# Patient Record
Sex: Female | Born: 1975 | Race: White | Hispanic: No | Marital: Single | Smoking: Never smoker
Health system: Southern US, Community
[De-identification: ages and names within clinical notes are randomized; demographics above are authoritative.]

## PROBLEM LIST (undated history)

## (undated) DIAGNOSIS — G540 Brachial plexus disorders: Secondary | ICD-10-CM

## (undated) DIAGNOSIS — F419 Anxiety disorder, unspecified: Secondary | ICD-10-CM

## (undated) DIAGNOSIS — J302 Other seasonal allergic rhinitis: Secondary | ICD-10-CM

## (undated) DIAGNOSIS — O9989 Other specified diseases and conditions complicating pregnancy, childbirth and the puerperium: Secondary | ICD-10-CM

## (undated) DIAGNOSIS — IMO0002 Reserved for concepts with insufficient information to code with codable children: Secondary | ICD-10-CM

## (undated) DIAGNOSIS — R87619 Unspecified abnormal cytological findings in specimens from cervix uteri: Secondary | ICD-10-CM

## (undated) DIAGNOSIS — O09529 Supervision of elderly multigravida, unspecified trimester: Secondary | ICD-10-CM

## (undated) DIAGNOSIS — O4440 Low lying placenta NOS or without hemorrhage, unspecified trimester: Secondary | ICD-10-CM

## (undated) HISTORY — DX: Brachial plexus disorders: G54.0

## (undated) HISTORY — PX: WISDOM TOOTH EXTRACTION: SHX21

## (undated) HISTORY — PX: INTRAUTERINE DEVICE (IUD) INSERTION: SHX5877

## (undated) HISTORY — DX: Supervision of elderly multigravida, unspecified trimester: O09.529

## (undated) HISTORY — DX: Low lying placenta nos or without hemorrhage, unspecified trimester: O44.40

## (undated) HISTORY — DX: Unspecified abnormal cytological findings in specimens from cervix uteri: R87.619

## (undated) HISTORY — DX: Reserved for concepts with insufficient information to code with codable children: IMO0002

---
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
## 1993-03-09 HISTORY — PX: KNEE SURGERY: SHX244

## 1998-04-05 ENCOUNTER — Other Ambulatory Visit: Admission: RE | Admit: 1998-04-05 | Discharge: 1998-04-05 | Payer: Self-pay | Admitting: Obstetrics & Gynecology

## 1999-06-25 ENCOUNTER — Other Ambulatory Visit: Admission: RE | Admit: 1999-06-25 | Discharge: 1999-06-25 | Payer: Self-pay | Admitting: Obstetrics & Gynecology

## 2000-09-22 ENCOUNTER — Other Ambulatory Visit: Admission: RE | Admit: 2000-09-22 | Discharge: 2000-09-22 | Payer: Self-pay | Admitting: *Deleted

## 2001-10-20 ENCOUNTER — Other Ambulatory Visit: Admission: RE | Admit: 2001-10-20 | Discharge: 2001-10-20 | Payer: Self-pay | Admitting: Obstetrics & Gynecology

## 2002-10-23 ENCOUNTER — Other Ambulatory Visit: Admission: RE | Admit: 2002-10-23 | Discharge: 2002-10-23 | Payer: Self-pay | Admitting: Obstetrics & Gynecology

## 2004-02-01 ENCOUNTER — Encounter: Admission: RE | Admit: 2004-02-01 | Discharge: 2004-02-01 | Payer: Self-pay | Admitting: Chiropractic Medicine

## 2004-03-20 ENCOUNTER — Encounter: Admission: RE | Admit: 2004-03-20 | Discharge: 2004-03-20 | Payer: Self-pay | Admitting: Orthopaedic Surgery

## 2005-03-09 DIAGNOSIS — O99891 Other specified diseases and conditions complicating pregnancy: Secondary | ICD-10-CM

## 2005-03-09 HISTORY — DX: Other specified diseases and conditions complicating pregnancy: O99.891

## 2005-11-04 ENCOUNTER — Inpatient Hospital Stay (HOSPITAL_COMMUNITY): Admission: AD | Admit: 2005-11-04 | Discharge: 2005-11-08 | Payer: Self-pay | Admitting: Obstetrics & Gynecology

## 2005-11-06 ENCOUNTER — Encounter (INDEPENDENT_AMBULATORY_CARE_PROVIDER_SITE_OTHER): Payer: Self-pay | Admitting: *Deleted

## 2006-10-11 ENCOUNTER — Encounter: Admission: RE | Admit: 2006-10-11 | Discharge: 2006-10-11 | Payer: Self-pay | Admitting: *Deleted

## 2007-11-07 ENCOUNTER — Encounter: Admission: RE | Admit: 2007-11-07 | Discharge: 2007-11-07 | Payer: Self-pay | Admitting: Obstetrics & Gynecology

## 2010-03-09 NOTE — L&D Delivery Note (Signed)
Delivery Note At 10:32 PM a healthy female was delivered via Vaginal, Spontaneous Delivery (Presentation: Left Occiput Anterior).  APGAR: 9, 9; weight .   Placenta status: Intact, Spontaneous.  Cord: 3 vessels with the following complications: None.  Cord pH: not done  Anesthesia: Epidural  Episiotomy: None Lacerations: 1st degree perineal Suture Repair: vicryl rapide Est. Blood Loss (mL):   Mom to postpartum.  Baby to nursery-stable.  Akiba Melfi,MARIE-LYNE 11/20/2010, 12:02 AM

## 2010-03-29 ENCOUNTER — Encounter: Payer: Self-pay | Admitting: Orthopaedic Surgery

## 2010-04-24 LAB — HIV ANTIBODY (ROUTINE TESTING W REFLEX): HIV: NONREACTIVE

## 2010-04-24 LAB — ABO/RH: RH Type: POSITIVE

## 2010-04-24 LAB — ANTIBODY SCREEN: Antibody Screen: NEGATIVE

## 2010-04-24 LAB — RPR: RPR: NONREACTIVE

## 2010-04-24 LAB — HEPATITIS B SURFACE ANTIGEN: Hepatitis B Surface Ag: NEGATIVE

## 2010-04-24 LAB — RUBELLA ANTIBODY, IGM: Rubella: IMMUNE

## 2010-07-25 NOTE — Discharge Summary (Signed)
NAME:  Wendy Lin, PARRILLO NO.:  [id number]   MEDICAL RECORD NO.:  [id number]          PATIENT TYPE:  INP   LOCATION:                                FACILITY:  [facility]   PHYSICIAN:  [place] B. Earlene Plater, M.D.       DATE OF BIRTH:   DATE OF ADMISSION:  11/04/2005  DATE OF DISCHARGE:  11/08/2005                                 DISCHARGE SUMMARY   ADMISSION DIAGNOSES:  1. Term intrauterine pregnancy.  2. Spontaneous labor.  3. Postpartum hemorrhage requiring transfusion.   DISCHARGE DIAGNOSES:  1. Term intrauterine pregnancy.  2. Spontaneous labor.  3. Postpartum hemorrhage requiring transfusion.   HISTORY OF PRESENT ILLNESS:  See the H&P on the chart. Briefly, the patient  presented at 39-6/7 weeks, gravida 1, para 0, spontaneous early labor.   Membranes were ruptured, labor augmented with pitocin and ultimately patient  had a spontaneous vaginal delivery of a viable female, Apgar's were 8 and 9.  Light meconium noted. Nuchal cord x1.  DeLee suction was performed on the  perineum and NICU was present at delivery. The baby did have mild increased  work of breathing and was therefore sent to the NICU for observation.   The patient experienced a moderate postpartum hemorrhage due to uterine  atony. There were no retained products on exam and there was no obvious  laceration on exam. Methergine one dose was necessary as well as uterine  massage and additional pitocin in the IV bag. Estimated blood loss was 700  cc.   Postpartum patient's hemoglobin nadir was 5.9 and she was symptomatic.  Therefore transfused 2 units of packed cells which resolved her symptoms and  her hemoglobin increased to 8.2. She was ultimately discharged home on the  third postpartum day in satisfactory condition.   DISCHARGE INSTRUCTIONS:  Per booklet.   DISCHARGE MEDICATIONS:  1. Ferrous sulfate 325 mg p.o. b.i.d.  2. Motrin 800 mg q.8 hours p.r.n. pain.   FOLLOW UP:  In 2 weeks at the office for  recheck of blood count.   DISCHARGE INSTRUCTIONS:  Discharged in satisfactory condition.      Gerri Spore B. Earlene Plater, M.D.  Electronically Signed     WBD/MEDQ  D:  12/30/2005  T:  12/31/2005  Job:  [id number]

## 2010-07-25 NOTE — H&P (Signed)
NAME:  Wendy Lin, Wendy Lin NO.:  [id number]   MEDICAL RECORD NO.:  [id number]          PATIENT TYPE:  MAT   LOCATION:  [facility]                          FACILITY:  [facility]   PHYSICIAN:  Lenoard Aden, M.D.DATE OF BIRTH:  07-01-1975   DATE OF ADMISSION:  11/04/2005  DATE OF DISCHARGE:                                HISTORY & PHYSICAL   CHIEF COMPLAINT:  Labor   HISTORY OF PRESENT ILLNESS:  She is a 35 year old white female G1, P0, EDD  is November 06, 2005, at 39-6/7 weeks with prodromal labor pattern and  greenish discharge that has been going on since the past Saturday.  Greenish  discharge was evidently evaluated in the office and felt to be normal by  speculum exam and otherwise normal AFI was noted.  The patient then  presented with increased frequency of contractions but no cervical change.  AFI was repeated and found to be between 6 and 7 which was in the lower  limits of normal for 39 weeks.  She was given Stadol with good relief and  now feels uncomfortable with irregular contractions every five to seven  minutes.  She is admitted for augmentation and attempts at delivery.  Risks  and benefits of this plan of management are discussed with the patient and  her husband today.   ALLERGIES:  CODEINE.   MEDICATIONS:  Singulair and prenatal vitamins.   PAST MEDICAL HISTORY:  1. Ascus Pap smear.  2. Wisdom tooth removal.  3. Urinary tract infection.  4. Knee surgery.  5. Gastroenteritis.   SOCIAL HISTORY:  She is a nonsmoker, nondrinker.  She denies domestic or  physical violence.   FAMILY HISTORY:  Mitral valve prolapse, emphysema, migraine headaches,  seizure disorder, depression, obsessive compulsive disorder, heart disease,  myocardial infarction, breast cancer and glaucoma.   PRENATAL COURSE:  Otherwise reportedly and per review of the prenatal record  uncomplicated.   PRIMARY CARE PHYSICIAN:  Genia Del, M.D.   PHYSICAL EXAMINATION:  GENERAL  APPEARANCE:  She is a well-developed, well-  nourished white female in no acute distress.  HEENT:  Normal.  LUNGS:  Clear.  CARDIOVASCULAR:  Regular rhythm.  ABDOMEN:  Soft, gravid and nontender.  Fetal heart rate tracing is reactive.  Contractions are irregular.  There are no decelerations, multiple  accelerations of greater than 15 x 15 beats per minute are noted.  PELVIC:  Cervix is 1-2 cm, vertex, 0 station, posterior, 70% effaced and  membranes are palpably intact.  There is a thickish, greenish mucus that is  noted on the pad and on the perineum which arouses a suspicion of possible  meconium.  EXTREMITIES:  No cords.  NEUROLOGIC:  Nonfocal.   IMPRESSION:  1. A 39-6/7 week intrauterine pregnancy.  2. Prodromal labor pattern.  3. Greenish discharge with low normal AFI, no obvious evidence of      spontaneous rupture of membranes.   PLAN:  Will admit, proceed with placement of an epidural, pitocin  augmentation.  Risks and benefits of this course of management discussed.  Possibility of increased C. section risk due to  augmentation and a prodromal  pattern have been discussed with the patient and her husband today.  They  wish to proceed.  Due to GBS positivity, she will be treated with penicillin  prophylaxis.      Lenoard Aden, M.D.  Electronically Signed     RJT/MEDQ  D:  11/05/2005  T:  11/05/2005  Job:  [id number]

## 2010-10-16 LAB — STREP B DNA PROBE: GBS: NEGATIVE

## 2010-11-14 ENCOUNTER — Telehealth (HOSPITAL_COMMUNITY): Payer: Self-pay | Admitting: *Deleted

## 2010-11-14 ENCOUNTER — Encounter (HOSPITAL_COMMUNITY): Payer: Self-pay | Admitting: *Deleted

## 2010-11-14 NOTE — Telephone Encounter (Signed)
Preadmission screen  

## 2010-11-15 ENCOUNTER — Inpatient Hospital Stay (HOSPITAL_COMMUNITY): Admit: 2010-11-15 | Payer: Self-pay | Admitting: Obstetrics & Gynecology

## 2010-11-17 ENCOUNTER — Telehealth (HOSPITAL_COMMUNITY): Payer: Self-pay | Admitting: *Deleted

## 2010-11-17 ENCOUNTER — Encounter (HOSPITAL_COMMUNITY): Payer: Self-pay | Admitting: *Deleted

## 2010-11-17 NOTE — Telephone Encounter (Signed)
Preadmission screen  

## 2010-11-19 ENCOUNTER — Inpatient Hospital Stay (HOSPITAL_COMMUNITY)
Admission: RE | Admit: 2010-11-19 | Discharge: 2010-11-21 | DRG: 372 | Disposition: A | Discharge status: Home / Self Care | Payer: BC Managed Care – PPO | Source: Ambulatory Visit | Attending: Obstetrics & Gynecology | Admitting: Obstetrics & Gynecology

## 2010-11-19 ENCOUNTER — Encounter (HOSPITAL_COMMUNITY): Payer: Self-pay | Admitting: Anesthesiology

## 2010-11-19 ENCOUNTER — Encounter (HOSPITAL_COMMUNITY): Payer: Self-pay

## 2010-11-19 ENCOUNTER — Inpatient Hospital Stay (HOSPITAL_COMMUNITY): Payer: BC Managed Care – PPO | Admitting: Anesthesiology

## 2010-11-19 DIAGNOSIS — O480 Post-term pregnancy: Principal | ICD-10-CM | POA: Diagnosis present

## 2010-11-19 DIAGNOSIS — O09529 Supervision of elderly multigravida, unspecified trimester: Secondary | ICD-10-CM | POA: Diagnosis present

## 2010-11-19 HISTORY — DX: Other specified diseases and conditions complicating pregnancy, childbirth and the puerperium: O99.89

## 2010-11-19 LAB — CBC
Hemoglobin: 13.3 g/dL (ref 12.0–15.0)
MCV: 90.3 fL (ref 78.0–100.0)
Platelets: 141 10*3/uL — ABNORMAL LOW (ref 150–400)
RBC: 4.35 MIL/uL (ref 3.87–5.11)
WBC: 8 10*3/uL (ref 4.0–10.5)

## 2010-11-19 LAB — RPR: RPR Ser Ql: NONREACTIVE

## 2010-11-19 MED ORDER — LACTATED RINGERS IV SOLN: 500.0000 mL | Freq: Once | INTRAVENOUS | Status: DC

## 2010-11-19 MED ORDER — LIDOCAINE HCL (PF) 1 % IJ SOLN
30.0000 mL | INTRAMUSCULAR | Status: DC | PRN
  Filled 2010-11-19: qty 30

## 2010-11-19 MED ORDER — FENTANYL 2.5 MCG/ML BUPIVACAINE 1/10 % EPIDURAL INFUSION (WH - ANES): 14.0000 mL/h | INTRAMUSCULAR | Status: DC

## 2010-11-19 MED ORDER — OXYTOCIN BOLUS FROM INFUSION
500.0000 mL | Freq: Once | INTRAVENOUS | Status: DC
  Filled 2010-11-19: qty 500
  Filled 2010-11-19: qty 1000

## 2010-11-19 MED ORDER — LIDOCAINE HCL 1.5 % IJ SOLN
INTRAMUSCULAR | Status: DC | PRN
  Administered 2010-11-19 (×2): 4 mL via EPIDURAL

## 2010-11-19 MED ORDER — PHENYLEPHRINE 40 MCG/ML (10ML) SYRINGE FOR IV PUSH (FOR BLOOD PRESSURE SUPPORT): 80.0000 ug | PREFILLED_SYRINGE | INTRAVENOUS | Status: DC | PRN

## 2010-11-19 MED ORDER — FENTANYL 2.5 MCG/ML BUPIVACAINE 1/10 % EPIDURAL INFUSION (WH - ANES)
14.0000 mL/h | INTRAMUSCULAR | Status: DC
  Filled 2010-11-19: qty 60

## 2010-11-19 MED ORDER — LACTATED RINGERS IV SOLN
INTRAVENOUS | Status: DC
  Administered 2010-11-19: 21:00:00 via INTRAVENOUS
  Administered 2010-11-19: 800 mL via INTRAVENOUS
  Administered 2010-11-19: 125 mL/h via INTRAVENOUS

## 2010-11-19 MED ORDER — DIPHENHYDRAMINE HCL 50 MG/ML IJ SOLN: 12.5000 mg | INTRAMUSCULAR | Status: DC | PRN

## 2010-11-19 MED ORDER — PHENYLEPHRINE 40 MCG/ML (10ML) SYRINGE FOR IV PUSH (FOR BLOOD PRESSURE SUPPORT)
80.0000 ug | PREFILLED_SYRINGE | INTRAVENOUS | Status: DC | PRN
  Filled 2010-11-19: qty 5

## 2010-11-19 MED ORDER — NALBUPHINE SYRINGE 5 MG/0.5 ML
10.0000 mg | INJECTION | Freq: Four times a day (QID) | INTRAMUSCULAR | Status: DC | PRN
  Filled 2010-11-19: qty 1

## 2010-11-19 MED ORDER — OXYCODONE-ACETAMINOPHEN 5-325 MG PO TABS
2.0000 | ORAL_TABLET | ORAL | Status: DC | PRN
  Filled 2010-11-19: qty 2
  Filled 2010-11-19: qty 1

## 2010-11-19 MED ORDER — FENTANYL 2.5 MCG/ML BUPIVACAINE 1/10 % EPIDURAL INFUSION (WH - ANES)
INTRAMUSCULAR | Status: DC | PRN
  Administered 2010-11-19: 15 mL/h via EPIDURAL

## 2010-11-19 MED ORDER — PHENYLEPHRINE 40 MCG/ML (10ML) SYRINGE FOR IV PUSH (FOR BLOOD PRESSURE SUPPORT)
80.0000 ug | PREFILLED_SYRINGE | INTRAVENOUS | Status: DC | PRN
  Filled 2010-11-19 (×2): qty 5

## 2010-11-19 MED ORDER — ACETAMINOPHEN 325 MG PO TABS: 650.0000 mg | ORAL_TABLET | ORAL | Status: DC | PRN

## 2010-11-19 MED ORDER — NALBUPHINE SYRINGE 5 MG/0.5 ML
5.0000 mg | INJECTION | INTRAMUSCULAR | Status: DC | PRN
  Filled 2010-11-19: qty 0.5

## 2010-11-19 MED ORDER — EPHEDRINE 5 MG/ML INJ: 10.0000 mg | INTRAVENOUS | Status: DC | PRN

## 2010-11-19 MED ORDER — IBUPROFEN 600 MG PO TABS
600.0000 mg | ORAL_TABLET | Freq: Four times a day (QID) | ORAL | Status: DC | PRN
  Administered 2010-11-20: 600 mg via ORAL
  Filled 2010-11-19 (×4): qty 1

## 2010-11-19 MED ORDER — METHYLERGONOVINE MALEATE 0.2 MG/ML IJ SOLN
INTRAMUSCULAR | Status: AC
  Administered 2010-11-19: 0.2 mg
  Filled 2010-11-19: qty 1

## 2010-11-19 MED ORDER — TERBUTALINE SULFATE 1 MG/ML IJ SOLN: 0.2500 mg | Freq: Once | INTRAMUSCULAR | Status: AC | PRN

## 2010-11-19 MED ORDER — CITRIC ACID-SODIUM CITRATE 334-500 MG/5ML PO SOLN: 30.0000 mL | ORAL | Status: DC | PRN

## 2010-11-19 MED ORDER — OXYTOCIN 20 UNITS IN LACTATED RINGERS INFUSION - SIMPLE
2.0000 m[IU]/min | INTRAVENOUS | Status: DC
  Administered 2010-11-19: 6 m[IU]/min via INTRAVENOUS
  Administered 2010-11-19: 3 m[IU]/min via INTRAVENOUS
  Administered 2010-11-19: 2 m[IU]/min via INTRAVENOUS
  Filled 2010-11-19: qty 1000

## 2010-11-19 MED ORDER — EPHEDRINE 5 MG/ML INJ
10.0000 mg | INTRAVENOUS | Status: DC | PRN
  Filled 2010-11-19 (×2): qty 4

## 2010-11-19 MED ORDER — EPHEDRINE 5 MG/ML INJ
10.0000 mg | INTRAVENOUS | Status: DC | PRN
  Filled 2010-11-19: qty 4

## 2010-11-19 MED ORDER — LACTATED RINGERS IV SOLN: 500.0000 mL | INTRAVENOUS | Status: DC | PRN

## 2010-11-19 MED ORDER — FLEET ENEMA 7-19 GM/118ML RE ENEM: 1.0000 | ENEMA | RECTAL | Status: DC | PRN

## 2010-11-19 MED ORDER — OXYTOCIN 20 UNITS IN LACTATED RINGERS INFUSION - SIMPLE
125.0000 mL/h | Freq: Once | INTRAVENOUS | Status: AC
  Administered 2010-11-19: 900 mL/h via INTRAVENOUS

## 2010-11-19 MED ORDER — OXYTOCIN 10 UNIT/ML IJ SOLN
INTRAMUSCULAR | Status: AC
  Administered 2010-11-19: 20 [IU] via INTRAVENOUS
  Filled 2010-11-19: qty 2

## 2010-11-19 MED ORDER — ONDANSETRON HCL 4 MG/2ML IJ SOLN: 4.0000 mg | Freq: Four times a day (QID) | INTRAMUSCULAR | Status: DC | PRN

## 2010-11-19 NOTE — Progress Notes (Signed)
Dr. Seymour Bars at bedside.  Strip reviewed.  Pt prepped for delivery.

## 2010-11-19 NOTE — Anesthesia Preprocedure Evaluation (Signed)
Anesthesia Evaluation  Name, MR# and DOB Patient awake  General Assessment Comment  Reviewed: Allergy & Precautions, H&P , Patient's Chart, lab work & pertinent test results  Airway Mallampati: III TM Distance: >3 FB Neck ROM: full    Dental No notable dental hx. (+) Teeth Intact   Pulmonary  clear to auscultation  pulmonary exam normalPulmonary Exam Normal breath sounds clear to auscultation none    Cardiovascular regular Normal    Neuro/Psych Negative Neurological ROS  Negative Psych ROS  GI/Hepatic/Renal negative GI ROS  negative Liver ROS  negative Renal ROS        Endo/Other  Negative Endocrine ROS (+)      Abdominal   Musculoskeletal   Hematology negative hematology ROS (+)   Peds  Reproductive/Obstetrics (+) Pregnancy    Anesthesia Other Findings             Anesthesia Physical Anesthesia Plan  ASA: II  Anesthesia Plan: Epidural   Post-op Pain Management:    Induction:   Airway Management Planned:   Additional Equipment:   Intra-op Plan:   Post-operative Plan:   Informed Consent: I have reviewed the patients History and Physical, chart, labs and discussed the procedure including the risks, benefits and alternatives for the proposed anesthesia with the patient or authorized representative who has indicated his/her understanding and acceptance.     Plan Discussed with: Anesthesiologist  Anesthesia Plan Comments:         Anesthesia Quick Evaluation  

## 2010-11-19 NOTE — Progress Notes (Addendum)
  S: Doing well, pain controlled with  Epidural. O: BP 130/107  Pulse 63  Temp(Src) 98.9 F (37.2 C) (Oral)  Resp 18  Ht 5\' 6"  (1.676 m)  Wt 75.751 kg (167 lb)  BMI 26.95 kg/m2  LMP 02/07/2010   FHT:  FHR: 120 bpm, variability: moderate,  accelerations:  Present,  decelerations:  Absent UC:   regular, every 2-3 minutes SVE:   Dilation: 4 Effacement (%): 90 Station: -1 Exam by:: Colon Flattery, CNM IUPC placed,   A / P: Induction of labor due to postdates,  progressing well on pitocin Exaggerated sims, reposition frequently Titrate pitocin to MVU 200 Fetal Wellbeing:  Category I Pain Control:  Epidural  Anticipated MOD:  NSVD Update to Dr. Seymour Bars, MD to follow.  Wendy Lin 11/19/2010, 8:50 PM

## 2010-11-19 NOTE — H&P (Signed)
Wendy Lin is a 35 y.o. female presenting for induction of labor post-dates Care at [facility] since first trim with Dr. Seymour Bars as primary.  Pitocin induction started this AM, AROM since 1320. Starting to get uncomfortable with contractions and desires epidural.  . Maternal Medical History:  Reason for admission: Reason for Admission:   nauseaIOL for post dates  Fetal activity: Perceived fetal activity is normal.    Prenatal complications: Hx PP hemorrhage with first birth after long difficult labor.  Low lying placenta resolved at 35 weeks.   Prenatal Complications - Diabetes: none.    OB History    Grav Para Term Preterm Abortions TAB SAB Ect Mult Living   2 1 1       1      Past Medical History  Diagnosis Date  . Low-lying placenta   . AMA (advanced maternal age) multigravida 35+   . Abnormal Pap smear     ASCUS, low grade squamous intraepithelial lesion  . Blood transfusion complicating pregnancy 2007    s/p uterine atony   Past Surgical History  Procedure Date  . Knee surgery 1995  . Mouth surgery 1994   Family History: family history includes Birth defects in her maternal aunt; COPD in her maternal grandfather; Cancer in her mother and paternal grandfather; Depression in her sister; Heart disease in her maternal grandmother, maternal uncle, and mother; Mental illness in her sister; Migraines in her sister; Mitral valve prolapse in her maternal uncle, mother, and sister; Seizures in her mother; and Vision loss in her father and maternal grandfather. Social History:  reports that she has never smoked. She does not have any smokeless tobacco history on file. She reports that she does not drink alcohol or use illicit drugs.  Review of Systems  Eyes: Negative for blurred vision.  Gastrointestinal: Negative for nausea and vomiting.  Neurological: Negative for headaches.    Dilation: 2 Effacement (%): 50 Station: -2 Exam by:: Montez Morita, RNC Blood pressure 117/85,  pulse 54, temperature 98.9 F (37.2 C), temperature source Oral, resp. rate 18, height 5\' 6"  (1.676 m), weight 75.751 kg (167 lb), last menstrual period 02/07/2010. Maternal Exam:  Uterine Assessment: Contraction strength is moderate.  Contraction frequency is regular.  Q 2-3 min on Pitocin  Abdomen: Fundal height is S=D.   Estimated fetal weight is 7.5.   Fetal presentation: vertex  Introitus: Normal vulva. Normal vagina.  Pelvis: adequate for delivery.   Cervix: Cervix evaluated by sterile speculum exam.     Fetal Exam Fetal Monitor Review: Mode: ultrasound.   Baseline rate: 130.  Variability: moderate (6-25 bpm).   Pattern: accelerations present and no decelerations.    Fetal State Assessment: Category I - tracings are normal.     Physical Exam  Constitutional: She is oriented to person, place, and time. She appears well-developed and well-nourished. No distress.  Cardiovascular: Normal rate and regular rhythm.   Respiratory: Effort normal and breath sounds normal.  GI: Soft. There is no tenderness.       gravid  Genitourinary: Vagina normal and uterus normal.  Musculoskeletal: She exhibits no edema.  Neurological: She is alert and oriented to person, place, and time.    Prenatal labs: ABO, Rh: O/Positive/-- (02/16 0000) Antibody: Negative (02/16 0000) Rubella:  immune RPR: NON REACTIVE (09/12 0815)  HBsAg: Negative (02/16 0000)  HIV: Non-reactive (02/16 0000)  GBS: Negative (08/09 0000)  1GTT: wnl Genetic screens WNL [place]: nl anatomy, posterior placenta Sono at 36 wks:AGA 69%, nl AFI  Assessment/Plan: 1. IUP at term 2. IOL post dates 3. Hx PP hemorrhage, plan active management of 3rd stage. 4. Latent labor with reassuring fetal status  5. Epidural PRN 6. IUPC  PAUL,DANIELA 11/19/2010, 7:43 PM

## 2010-11-19 NOTE — Progress Notes (Signed)
Clement Sayres, RN at bedside with pt for epidural placement.

## 2010-11-19 NOTE — Progress Notes (Signed)
Foley cath placed at this time.  Pt tolerated well.

## 2010-11-19 NOTE — Anesthesia Procedure Notes (Signed)
Epidural Patient location during procedure: OB Start time: 11/19/2010 8:06 PM  Staffing Anesthesiologist: Landra Howze A. Performed by: anesthesiologist   Preanesthetic Checklist Completed: patient identified, site marked, surgical consent, pre-op evaluation, timeout performed, IV checked, risks and benefits discussed and monitors and equipment checked  Epidural Patient position: sitting Prep: site prepped and draped and DuraPrep Patient monitoring: continuous pulse ox and blood pressure Approach: midline Injection technique: LOR air  Needle:  Needle type: Tuohy  Needle gauge: 17 G Needle length: 9 cm Needle insertion depth: 5 cm cm Catheter type: closed end flexible Catheter size: 19 Gauge Catheter at skin depth: 10 cm Test dose: negative and 1.5% lidocaine  Assessment Events: blood not aspirated, injection not painful, no injection resistance, negative IV test and no paresthesia  Additional Notes Patient is more comfortable after epidural dosed. Please see RN's note for documentation of vital signs and FHR which are stable.

## 2010-11-19 NOTE — Progress Notes (Signed)
Colon Flattery, CNM at bedside. Discussing epidural with pt.  Pt wishes to proceed with epidural.

## 2010-11-19 NOTE — Progress Notes (Signed)
Extern monitors placed for IUPC placement.

## 2010-11-20 ENCOUNTER — Encounter (HOSPITAL_COMMUNITY): Payer: Self-pay

## 2010-11-20 LAB — CBC
MCV: 91.4 fL (ref 78.0–100.0)
Platelets: 122 10*3/uL — ABNORMAL LOW (ref 150–400)
RBC: 3.49 MIL/uL — ABNORMAL LOW (ref 3.87–5.11)
RDW: 12.8 % (ref 11.5–15.5)

## 2010-11-20 MED ORDER — LANOLIN HYDROUS EX OINT: TOPICAL_OINTMENT | CUTANEOUS | Status: DC | PRN

## 2010-11-20 MED ORDER — BENZOCAINE-MENTHOL 20-0.5 % EX AERO
INHALATION_SPRAY | CUTANEOUS | Status: AC
  Administered 2010-11-20: 1 via TOPICAL
  Filled 2010-11-20: qty 56

## 2010-11-20 MED ORDER — IBUPROFEN 600 MG PO TABS
600.0000 mg | ORAL_TABLET | Freq: Four times a day (QID) | ORAL | Status: DC
  Administered 2010-11-20 – 2010-11-21 (×3): 600 mg via ORAL
  Filled 2010-11-20: qty 1

## 2010-11-20 MED ORDER — ONDANSETRON HCL 4 MG PO TABS
4.0000 mg | ORAL_TABLET | ORAL | Status: DC | PRN
Start: 2010-11-20 — End: 2010-11-21

## 2010-11-20 MED ORDER — TETANUS-DIPHTH-ACELL PERTUSSIS 5-2.5-18.5 LF-MCG/0.5 IM SUSP
0.5000 mL | Freq: Once | INTRAMUSCULAR | Status: AC
  Administered 2010-11-21: 0.5 mL via INTRAMUSCULAR
  Filled 2010-11-20 (×2): qty 0.5

## 2010-11-20 MED ORDER — BENZOCAINE-MENTHOL 20-0.5 % EX AERO
1.0000 "application " | INHALATION_SPRAY | CUTANEOUS | Status: DC | PRN
  Administered 2010-11-20: 1 via TOPICAL

## 2010-11-20 MED ORDER — MISOPROSTOL 200 MCG PO TABS
ORAL_TABLET | ORAL | Status: AC
  Administered 2010-11-20: 1000 ug via RECTAL
  Filled 2010-11-20: qty 5

## 2010-11-20 MED ORDER — OXYCODONE-ACETAMINOPHEN 5-325 MG PO TABS
1.0000 | ORAL_TABLET | ORAL | Status: DC | PRN
  Administered 2010-11-20 (×2): 1 via ORAL
  Filled 2010-11-20: qty 1

## 2010-11-20 MED ORDER — ZOLPIDEM TARTRATE 5 MG PO TABS
5.0000 mg | ORAL_TABLET | Freq: Every evening | ORAL | Status: DC | PRN
Start: 2010-11-20 — End: 2010-11-21

## 2010-11-20 MED ORDER — METHYLERGONOVINE MALEATE 0.2 MG/ML IJ SOLN: 0.2000 mg | Freq: Four times a day (QID) | INTRAMUSCULAR | Status: AC

## 2010-11-20 MED ORDER — OXYTOCIN 20 UNITS IN LACTATED RINGERS INFUSION - SIMPLE
125.0000 mL/h | INTRAVENOUS | Status: DC
  Filled 2010-11-20: qty 1000

## 2010-11-20 MED ORDER — SIMETHICONE 80 MG PO CHEW: 80.0000 mg | CHEWABLE_TABLET | ORAL | Status: DC | PRN

## 2010-11-20 MED ORDER — MISOPROSTOL 200 MCG PO TABS
1000.0000 ug | ORAL_TABLET | Freq: Once | ORAL | Status: AC
  Administered 2010-11-20: 1000 ug via RECTAL

## 2010-11-20 MED ORDER — DIPHENHYDRAMINE HCL 25 MG PO CAPS: 25.0000 mg | ORAL_CAPSULE | Freq: Four times a day (QID) | ORAL | Status: DC | PRN

## 2010-11-20 MED ORDER — METHYLERGONOVINE MALEATE 0.2 MG PO TABS
0.2000 mg | ORAL_TABLET | Freq: Four times a day (QID) | ORAL | Status: AC
  Administered 2010-11-20 – 2010-11-21 (×4): 0.2 mg via ORAL
  Filled 2010-11-20 (×4): qty 1

## 2010-11-20 MED ORDER — OXYTOCIN 20 UNITS IN LACTATED RINGERS INFUSION - SIMPLE
125.0000 mL/h | INTRAVENOUS | Status: AC
  Filled 2010-11-20: qty 1000

## 2010-11-20 MED ORDER — WITCH HAZEL-GLYCERIN EX PADS: 1.0000 "application " | MEDICATED_PAD | CUTANEOUS | Status: DC | PRN

## 2010-11-20 MED ORDER — DIBUCAINE 1 % RE OINT: 1.0000 "application " | TOPICAL_OINTMENT | RECTAL | Status: DC | PRN

## 2010-11-20 MED ORDER — METHYLERGONOVINE MALEATE 0.2 MG/ML IJ SOLN
INTRAMUSCULAR | Status: AC
  Administered 2010-11-20: 0.2 mg via INTRAMUSCULAR
  Filled 2010-11-20: qty 1

## 2010-11-20 MED ORDER — PRENATAL PLUS 27-1 MG PO TABS
1.0000 | ORAL_TABLET | Freq: Every day | ORAL | Status: DC
  Administered 2010-11-20 – 2010-11-21 (×2): 1 via ORAL
  Filled 2010-11-20 (×2): qty 1

## 2010-11-20 MED ORDER — SENNOSIDES-DOCUSATE SODIUM 8.6-50 MG PO TABS
2.0000 | ORAL_TABLET | Freq: Every day | ORAL | Status: DC
  Administered 2010-11-20: 2 via ORAL

## 2010-11-20 MED ORDER — ONDANSETRON HCL 4 MG/2ML IJ SOLN: 4.0000 mg | INTRAMUSCULAR | Status: DC | PRN

## 2010-11-20 NOTE — Anesthesia Postprocedure Evaluation (Signed)
  Anesthesia Post-op Note  Patient: Wendy Lin  Procedure(s) Performed: * Lumbar Epidural for L & D *  Patient Location: Rm. 317  Anesthesia Type: Epidural  Level of Consciousness: awake, alert  and oriented  Airway and Oxygen Therapy: Patient Spontanous Breathing  Post-op Pain: none  Post-op Assessment: Post-op Vital signs reviewed, Patient's Cardiovascular Status Stable, Respiratory Function Stable, Patent Airway, No signs of Nausea or vomiting, Pain level controlled, No headache, No backache, No residual numbness and No residual motor weakness  Post-op Vital Signs: Reviewed and stable  Complications: No apparent anesthesia complications

## 2010-11-20 NOTE — Progress Notes (Signed)
UR Chart review completed.  

## 2010-11-20 NOTE — Progress Notes (Signed)
Post Partum Day #1            Subjective:   Pt reports feeling well/ Tolerating po/ Voiding without problems/ No n/v/ up ad lib Bleeding is decreasing now, was heavy during the night, received Methergine IM and PO, and Cytotec PR/  Pain controlled withprescription NSAID's including ibuprofen (Motrin) and narcotic analgesics including Percocet  Newborn info:  Information for the patient's newborn:  Joyceline, Maiorino [[id number]]  female  / circumcision done / feeding breast          Objective:     VS: Blood pressure 110/64, pulse 72, temperature 98.6 F (37 C), temperature source Oral, resp. rate 18, height 5\' 6"  (1.676 m), weight 75.751 kg (167 lb), last menstrual period 02/07/2010, SpO2 98.00%,  currently breastfeeding.   Intake/Output Summary (Last 24 hours) at 11/20/10 1058 Last data filed at 11/20/10 0640  Gross per 24 hour  Intake    240 ml  Output   1200 ml  Net   -960 ml        Basename 11/20/10 0528 11/19/10 0815  WBC 18.5* 8.0  HGB 10.8* 13.3  HCT 31.9* 39.3  PLT 122* 141*     Blood type: O/Positive/-- (02/16 0000)  Rubella: Immune (02/16 0000)     Physical Exam:   A & O x 3 NAD   Lungs: CTAB  Heart: RR  Abdomen: soft, non-tender, FF @ U  Perineum: repair intact, edema minimal  Lochia: small  Extremities: neg Homans', edema none    Assessment/Plan:  PPD # 1 / 35 y.o., [id number] S/P:induced vaginal Principal Problem:  *Postpartum care following vaginal delivery (9/12) Hx of PP hemorrhage with first child Uterine atony now resolved after Methergine and Cytotec, will continue to monitor closely. Patient showed how to massage own uterus Hgb stable   normal postpartum exam  Doing well  Continue routine post partum orders  Anticipate D/C in AM         LOS: 1 day   Jaritza Duignan, CNM, MSN 11/20/2010, 10:58 AM

## 2010-11-20 NOTE — Progress Notes (Signed)
Patient transferred to room 317 for continued post partum care.

## 2010-11-21 MED ORDER — PRENATAL PLUS 27-1 MG PO TABS: 1.0000 | ORAL_TABLET | Freq: Every day | ORAL | Status: DC

## 2010-11-21 MED ORDER — MENTHOL 3 MG MT LOZG: 1.0000 | LOZENGE | OROMUCOSAL | Status: DC | PRN

## 2010-11-21 NOTE — Progress Notes (Signed)
Encounter addended by: Madison Hickman on: 11/21/2010 10:26 AM<BR>     Documentation filed: Notes Section, Charges VN

## 2010-11-21 NOTE — Anesthesia Postprocedure Evaluation (Signed)
  Anesthesia Post-op Note  Patient: Wendy Lin  Procedure(s) Performed: * No procedures listed *  Patient Location: [facility] Unit  Anesthesia Type: Epidural  Level of Consciousness: awake, alert  and oriented  Airway and Oxygen Therapy: Patient Spontanous Breathing  Post-op Pain: none  Post-op Assessment: Post-op Vital signs reviewed, Patient's Cardiovascular Status Stable, No headache, No backache, No residual numbness and No residual motor weakness  Post-op Vital Signs: Reviewed and stable  Complications: No apparent anesthesia complications

## 2010-11-21 NOTE — Discharge Summary (Signed)
Obstetric Discharge Summary Reason for Admission: IOL, post dates Prenatal Procedures: ultrasound Intrapartum Procedures: spontaneous vaginal delivery; hx pp hemorrhage with previous delivery Postpartum Procedures: none Complications-Operative and Postpartum: none Hemoglobin  Date Value Range Status  11/20/2010 10.8* 12.0-15.0 (g/dL) Final     REPEATED TO VERIFY     DELTA CHECK NOTED     HCT  Date Value Range Status  11/20/2010 31.9* 36.0-46.0 (%) Final    Discharge Diagnoses: Term Pregnancy-delivered  Discharge Information: Date: 11/21/2010 Activity: pelvic rest Diet: routine Medications: PNV Condition: stable Instructions: refer to practice specific booklet Discharge to: home Follow-up Information    Follow up with LAVOIE,MARIE-LYNE.   Contact information:   [address] [place] [address] [phone]          Newborn Data: Live born female on 11/19/2010 Birth Weight: 7 lb 15.3 oz (3609 g) APGAR: 9, 9  Home with mother.  Khai Arrona K 11/21/2010, 10:31 AM

## 2010-11-21 NOTE — Progress Notes (Signed)
  PPD # 2  Subjective: Pt reports feeling well and eager for d/c home/ Pain controlled with prescription NSAID's including ibuprofen Tolerating po/ Voiding without problems/ No n/v Bleeding is moderate/ Newborn info:  Information for the patient's newborn:  Deshara, Rossi [[id number]]  female  / circ complete/ Feeding: breast    Objective:  VS: Blood pressure 125/73, pulse 62, temperature 98.6 F (37 C), temperature source Oral, resp. rate 18, height 5\' 6"  (1.676 m), weight 75.751 kg (167 lb), last menstrual period 02/07/2010, SpO2 98.00%, unknown if currently breastfeeding.    Basename 11/20/10 0528 11/19/10 0815  WBC 18.5* 8.0  HGB 10.8* 13.3  HCT 31.9* 39.3  PLT 122* 141*    Blood type: O/Positive/-- (02/16 0000) Rubella: Immune (02/16 0000)    Physical Exam:  General: alert, cooperative and no distress Abdomen: soft, nontender, normal bowel sounds Uterine Fundus: firm, below umbilicus, nontender Perineum: not inspected Lochia: moderate Ext: Homans sign is negative, no sign of DVT and no edema, redness or tenderness in the calves or thighs   A/P: PPD # 2/ [id number] Doing well and stable for discharge home RX: Ibuprofen 600mg  po Q 6 hrs prn pain #30 Refill x 1 WOB/GYN booklet given Routine pp visit in 6wks

## 2013-01-11 ENCOUNTER — Other Ambulatory Visit: Payer: Self-pay

## 2013-01-11 DIAGNOSIS — Z1231 Encounter for screening mammogram for malignant neoplasm of breast: Secondary | ICD-10-CM

## 2013-02-16 ENCOUNTER — Ambulatory Visit
Admission: RE | Admit: 2013-02-16 | Discharge: 2013-02-16 | Disposition: A | Discharge status: Home / Self Care | Payer: BC Managed Care – PPO | Source: Ambulatory Visit

## 2013-02-16 DIAGNOSIS — Z1231 Encounter for screening mammogram for malignant neoplasm of breast: Secondary | ICD-10-CM

## 2014-01-08 ENCOUNTER — Encounter (HOSPITAL_COMMUNITY): Payer: Self-pay

## 2015-03-10 HISTORY — PX: COLPOSCOPY: SHX161

## 2016-08-06 ENCOUNTER — Telehealth: Payer: Self-pay | Admitting: *Deleted

## 2016-08-06 NOTE — Telephone Encounter (Signed)
Patient had C&B done at [facility] on 03/18/16 was told to come back in 6 months are we scheduling repeat pap smear? (paper chart placed on your desk) Please advise

## 2016-08-06 NOTE — Telephone Encounter (Signed)
Chart reviewed.  Yes, please schedule Repeat Pap 10/2016.

## 2016-08-07 ENCOUNTER — Telehealth: Payer: Self-pay | Admitting: *Deleted

## 2016-08-07 NOTE — Telephone Encounter (Signed)
Sent claudia message to schedule.

## 2016-08-07 NOTE — Telephone Encounter (Signed)
Pt called c/o night sweats, felt lightheaded a couple of time, asked what this could be has mirena IUD. I explained to pt could be related to several different issues, I advised pt lab could be ran, for CBC, TSH, etc. But would need visit to do this. Pt verbalized she understood, will monitor and if continues will schedule OV.

## 2016-10-14 ENCOUNTER — Ambulatory Visit: Payer: Self-pay | Admitting: Obstetrics & Gynecology

## 2016-10-14 ENCOUNTER — Encounter: Payer: Self-pay | Admitting: Obstetrics & Gynecology

## 2016-10-14 ENCOUNTER — Ambulatory Visit (INDEPENDENT_AMBULATORY_CARE_PROVIDER_SITE_OTHER): Payer: BLUE CROSS/BLUE SHIELD | Admitting: Obstetrics & Gynecology

## 2016-10-14 VITALS — BP 114/70 | Ht 66.0 in | Wt 133.0 lb

## 2016-10-14 DIAGNOSIS — B977 Papillomavirus as the cause of diseases classified elsewhere: Secondary | ICD-10-CM

## 2016-10-14 DIAGNOSIS — Z1151 Encounter for screening for human papillomavirus (HPV): Secondary | ICD-10-CM

## 2016-10-14 DIAGNOSIS — N870 Mild cervical dysplasia: Secondary | ICD-10-CM

## 2016-10-14 NOTE — Addendum Note (Signed)
Addended by: Thurnell Garbe A on: 10/14/2016 12:34 PM   Modules accepted: Orders

## 2016-10-14 NOTE — Progress Notes (Signed)
    Wendy Lin [birth year] [id number]        41 y.o.  G2P2002 Divorced.  Stable boyfriend x 1 year.  Children 5th grade and Kindergarden.  RP:  H/O Abnormal Pap/HPV HR pos  HPI:  Had Colpo 6 months ago.  Will obtain Medical records.  Per patient HPV 16-18-45 neg.  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    Past medical history,surgical history, problem list, medications, allergies, family history and social history were all reviewed and documented in the EPIC chart.  Directed ROS with pertinent positives and negatives documented in the history of present illness/assessment and plan.  Exam:  Vitals:   10/14/16 1155  BP: 114/70  Weight: 133 lb (60.3 kg)  Height: 5' 6"  (1.676 m)   General appearance:  Normal  Gyn exam:  Vulva normal                     Speculum:  Cervix/Vagina normal.  Pap/HPV HR done.  Assessment/Plan:  41 y.o. G2P2002   1. 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  F/U Annual/Gyn exam in 6 months.  2. 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  Princess Bruins MD, 12:01 PM 10/14/2016

## 2016-10-14 NOTE — Patient Instructions (Signed)
1. 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  F/U Annual/Gyn exam in 6 months.  2. 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  Wendy Lin, it was a pleasure to see you today!  I will inform you of your results as soon as available.

## 2016-10-19 LAB — PAP, TP IMAGING W/ HPV RNA, RFLX HPV TYPE 16,18/45: HPV mRNA, High Risk: NOT DETECTED

## 2017-12-10 ENCOUNTER — Encounter: Payer: BLUE CROSS/BLUE SHIELD | Admitting: Obstetrics & Gynecology

## 2017-12-14 ENCOUNTER — Ambulatory Visit: Payer: BLUE CROSS/BLUE SHIELD | Admitting: Obstetrics & Gynecology

## 2017-12-14 ENCOUNTER — Encounter: Payer: Self-pay | Admitting: Obstetrics & Gynecology

## 2017-12-14 VITALS — BP 110/70 | Ht 66.0 in | Wt 134.0 lb

## 2017-12-14 DIAGNOSIS — Z01419 Encounter for gynecological examination (general) (routine) without abnormal findings: Secondary | ICD-10-CM

## 2017-12-14 DIAGNOSIS — Z1151 Encounter for screening for human papillomavirus (HPV): Secondary | ICD-10-CM | POA: Diagnosis not present

## 2017-12-14 DIAGNOSIS — Z23 Encounter for immunization: Secondary | ICD-10-CM

## 2017-12-14 DIAGNOSIS — Z30431 Encounter for routine checking of intrauterine contraceptive device: Secondary | ICD-10-CM

## 2017-12-14 DIAGNOSIS — N870 Mild cervical dysplasia: Secondary | ICD-10-CM

## 2017-12-14 NOTE — Progress Notes (Signed)
Wendy Lin 1976-01-22 [id number]   History:    42 y.o. G2P2L2 Boyfriend x 2 yrs.  Children:  Son 6th grade at [employer].  Daughter 1st grade.  RP:  Established patient presenting for annual gyn exam   HPI: Well on Mirena IUD x 02/2016.  No BTB.  No pelvic pain.  Normal vaginal secretions.  No pain with IC.  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  HPV 16-18-45 neg.  Pap neg/HPV HR neg 10/2016. Urine/BMs wnl. Breasts wnl.  BMI 21.63.  Fit and healthy nutrition.  Health labs with Fam MD.  Past medical history,surgical history, family history and social history were all reviewed and documented in the EPIC chart.  Gynecologic History No LMP recorded. (Menstrual status: IUD). Contraception: Mirena IUD x 02/2016 Last Pap: 10/2016. Results were: Negative/HPV HR neg.   Last mammogram: 2019. Results were: normal Bone Density: Never Colonoscopy: Never  Obstetric History OB History  Gravida Para Term Preterm AB Living  2 2 2     2   SAB TAB Ectopic Multiple Live Births          2    # Outcome Date GA Lbr Len/2nd Weight Sex Delivery Anes PTL Lv  2 Term 11/19/10 52w5d04:10 / 00:22 7 lb 15.3 oz (3.609 kg) M Vag-Spont EPI  LIV     Birth Comments: None  1 Term 2007 464w0d7 lb 8 oz (3.402 kg) M Vag-Spont EPI  LIV     ROS: A ROS was performed and pertinent positives and negatives are included in the history.  GENERAL: No fevers or chills. HEENT: No change in vision, no earache, sore throat or sinus congestion. NECK: No pain or stiffness. CARDIOVASCULAR: No chest pain or pressure. No palpitations. PULMONARY: No shortness of breath, cough or wheeze. GASTROINTESTINAL: No abdominal pain, nausea, vomiting or diarrhea, melena or bright red blood per rectum. GENITOURINARY: No urinary frequency, urgency, hesitancy or dysuria. MUSCULOSKELETAL: No joint or muscle pain, no back pain, no recent trauma. DERMATOLOGIC: No rash, no itching, no lesions. ENDOCRINE: No polyuria, polydipsia, no heat or cold intolerance. No recent change in  weight. HEMATOLOGICAL: No anemia or easy bruising or bleeding. NEUROLOGIC: No headache, seizures, numbness, tingling or weakness. PSYCHIATRIC: No depression, no loss of interest in normal activity or change in sleep pattern.     Exam:   BP 110/70   Ht 5' 6"  (1.676 m)   Wt 134 lb (60.8 kg)   BMI 21.63 kg/m   Body mass index is 21.63 kg/m.  General appearance : Well developed well nourished female. No acute distress HEENT: Eyes: no retinal hemorrhage or exudates,  Neck supple, trachea midline, no carotid bruits, no thyroidmegaly Lungs: Clear to auscultation, no rhonchi or wheezes, or rib retractions  Heart: Regular rate and rhythm, no murmurs or gallops Breast:Examined in sitting and supine position were symmetrical in appearance, no palpable masses or tenderness,  no skin retraction, no nipple inversion, no nipple discharge, no skin discoloration, no axillary or supraclavicular lymphadenopathy Abdomen: no palpable masses or tenderness, no rebound or guarding Extremities: no edema or skin discoloration or tenderness  Pelvic: Vulva: Normal             Vagina: No gross lesions or discharge  Cervix: No gross lesions or discharge.  Strings visible.  Pap /HPV HR done  Uterus  AV, normal size, shape and consistency, non-tender and mobile  Adnexa  Without masses or tenderness  Anus: Normal   Assessment/Plan:  [age].o. female for annual exam  1. Encounter for routine gynecological examination with Papanicolaou smear of cervix Normal gynecologic exam.  Pap with high-risk HPV done today.  Breast exam normal.  Screening mammogram was negative in 2019.  Health labs with family physician.  2. Encounter for routine checking of intrauterine contraceptive device (IUD) Mirena IUD since 2017 well-tolerated and in good position.  3. Dysplasia of cervix, low grade (CIN 1) History of mild cervical dysplasia on colposcopy in January 2018.  Last Pap test was negative in August 2018.  Repeat Pap test  with high risk HPV today.  Princess Bruins MD, 11:22 AM 12/14/2017

## 2017-12-16 LAB — PAP, TP IMAGING W/ HPV RNA, RFLX HPV TYPE 16,18/45: HPV DNA High Risk: NOT DETECTED

## 2017-12-19 ENCOUNTER — Encounter: Payer: Self-pay | Admitting: Obstetrics & Gynecology

## 2017-12-19 NOTE — Patient Instructions (Signed)
1. Encounter for routine gynecological examination with Papanicolaou smear of cervix Normal gynecologic exam.  Pap with high-risk HPV done today.  Breast exam normal.  Screening mammogram was negative in 2019.  Health labs with family physician.  2. Encounter for routine checking of intrauterine contraceptive device (IUD) Mirena IUD since 2017 well-tolerated and in good position.  3. Dysplasia of cervix, low grade (CIN 1) History of mild cervical dysplasia on colposcopy in January 2018.  Last Pap test was negative in August 2018.  Repeat Pap test with high risk HPV today.  Wendy Lin, a pleasure seeing you today!  I will inform you of your results as soon as they are available.

## 2018-10-06 ENCOUNTER — Other Ambulatory Visit: Payer: Self-pay

## 2018-10-06 ENCOUNTER — Encounter: Payer: Self-pay | Admitting: Allergy

## 2018-10-06 ENCOUNTER — Ambulatory Visit (INDEPENDENT_AMBULATORY_CARE_PROVIDER_SITE_OTHER): Payer: BC Managed Care – PPO | Admitting: Allergy

## 2018-10-06 VITALS — BP 110/82 | HR 70 | Temp 97.9°F | Resp 16 | Ht 66.0 in | Wt 134.0 lb

## 2018-10-06 DIAGNOSIS — L2089 Other atopic dermatitis: Secondary | ICD-10-CM

## 2018-10-06 DIAGNOSIS — J3089 Other allergic rhinitis: Secondary | ICD-10-CM | POA: Diagnosis not present

## 2018-10-06 MED ORDER — TRIAMCINOLONE ACETONIDE 0.1 % EX OINT: 1.0000 "application " | TOPICAL_OINTMENT | Freq: Two times a day (BID) | CUTANEOUS | 5 refills | Status: DC

## 2018-10-06 MED ORDER — PIMECROLIMUS 1 % EX CREA: TOPICAL_CREAM | Freq: Two times a day (BID) | CUTANEOUS | 5 refills | Status: DC

## 2018-10-06 NOTE — Progress Notes (Signed)
New Patient Note  RE: Wendy Lin MRN: [id number] DOB: August 30, 1975 Date of Office Visit: 10/06/2018  Referring provider: Helane Rima, MD Primary care provider: Helane Rima, MD  Chief Complaint: rash    History of present illness: Wendy Lin is a 43 y.o. female presenting today for consultation for rash.   She states she has been having allergy symptoms since last Oct 2019.  She states the symptoms are itchy rash.   She has seen her PCP and dermatologist for the rash.  She states she will get the rash in armpits, eyelid, behind ears, palms.  The rash is red and splotchy and itchy.  The rash comes and goes.  Every morning she wakes up and her palms are very itchy.   The rash of eyelids or armpits or behind the ears she states may be present every other day. She used to eat oatmeal a lot and thought this was a trigger to the rash thus she took oatmeal out of diet however the rash continued.  She also took gluten  products out of the diet as she does feel it makes her itchier.  She does feel like a food is a trigger of her rash.  She moisturizes with lubriderm, oil of olay or emollients without fragrance and dyes.  She has been using triamcinolone but does not feel that it is very effective.    She denies any swelling, fevers, GI, respiratory or CV related symptoms with the rash.    She does report spring allergy symptoms with nasal congestion and will use nasacort.   No history eczema, asthma or food allergy.    Review of systems: Review of Systems  Constitutional: Negative for chills, fever and malaise/fatigue.  HENT: Negative for congestion, ear discharge, nosebleeds and sore throat.   Eyes: Negative for pain, discharge and redness.  Respiratory: Negative for cough, shortness of breath and wheezing.   Cardiovascular: Negative for chest pain.  Gastrointestinal: Negative for abdominal pain, constipation, diarrhea, heartburn, nausea and vomiting.  Musculoskeletal:  Negative for joint pain.  Skin: Positive for itching and rash.  Neurological: Negative for headaches.    All other systems negative unless noted above in HPI  Past medical history: Past Medical History:  Diagnosis Date  . Abnormal Pap smear    ASCUS, low grade squamous intraepithelial lesion  . AMA (advanced maternal age) multigravida 35+   . Blood transfusion complicating pregnancy 2094   s/p uterine atony  . Low-lying placenta   . Postpartum care following vaginal delivery (9/12) 11/20/2010    Past surgical history: Past Surgical History:  Procedure Laterality Date  . COLPOSCOPY  2017  . KNEE SURGERY  1995  . MOUTH SURGERY  1994  . WISDOM TOOTH EXTRACTION      Family history:  Family History  Problem Relation Age of Onset  . Mitral valve prolapse Mother   . Seizures Mother        in pregnancy  . Heart disease Mother   . Cancer Mother        breast  . Vision loss Father        glacoma  . Mitral valve prolapse Sister   . Depression Sister   . Migraines Sister   . Mental illness Sister   . Mitral valve prolapse Maternal Uncle   . Heart disease Maternal Uncle   . Heart disease Maternal Grandmother   . COPD Maternal Grandfather   . Vision loss Maternal Grandfather  glacoma  . Birth defects Maternal Aunt        Breast  . Cancer Paternal Grandfather        prostate ca    Social history: She lives in a home with carpeting in the bedroom with gas heating and central cooling.  There are 2 cats and 1 turtle in the home.  There is no concern for water damage, mildew or roaches in the home.  She is a [employer].  She denies a smoking history.  Medication List: Allergies as of 10/06/2018      Reactions   Codeine Nausea And Vomiting      Medication List       Accurate as of October 06, 2018  5:12 PM. If you have any questions, ask your nurse or doctor.        ARTIFICIAL TEARS PF OP Apply to eye.   levonorgestrel 20 MCG/24HR IUD Commonly  known as: MIRENA by Intrauterine route.   neomycin-polymyxin b-dexamethasone 3.5-10000-0.1 Oint Commonly known as: MAXITROL   pimecrolimus 1 % cream Commonly known as: Elidel Apply topically 2 (two) times daily. Started by: Eshika Reckart Charmian Muff, MD   triamcinolone 55 MCG/ACT Aero nasal inhaler Commonly known as: NASACORT Place 2 sprays into the nose daily.   triamcinolone cream 0.1 % Commonly known as: KENALOG APPLY 1 APPLICATION ON THE SKIN TWICE A DAY AS NEEDED FOR FLARES NOT TO BE APPLIED TO FACE What changed: Another medication with the same name was added. Make sure you understand how and when to take each. Changed by: Rehana Uncapher Charmian Muff, MD   triamcinolone ointment 0.1 % Commonly known as: KENALOG Apply 1 application topically 2 (two) times daily. What changed: You were already taking a medication with the same name, and this prescription was added. Make sure you understand how and when to take each. Changed by: Stormie Ventola Charmian Muff, MD       Known medication allergies: Allergies  Allergen Reactions  . Codeine Nausea And Vomiting     Physical examination: Blood pressure 110/82, pulse 70, temperature 97.9 F (36.6 C), temperature source Temporal, resp. rate 16, height 5' 6"  (1.676 m), weight 134 lb (60.8 kg), SpO2 99 %.  General: Alert, interactive, in no acute distress. HEENT: PERRLA, TMs pearly gray, turbinates non-edematous without discharge, post-pharynx non erythematous. Neck: Supple without lymphadenopathy. Lungs: Clear to auscultation without wheezing, rhonchi or rales. {no increased work of breathing. CV: Normal S1, S2 without murmurs. Abdomen: Nondistended, nontender. Skin: Warm and dry, without lesions or rashes.  Rashes not present today at time of visit. Extremities:  No clubbing, cyanosis or edema. Neuro:   Grossly intact.  Diagnositics/Labs:  Allergy testing: Environmental allergy skin prick testing is positive to molds, dust mites  and cat. Intradermal testing is negative. Select food allergy skin prick testing is positive to wheat, pork, [place], cabbage, watermelon. Allergy testing results were read and interpreted by provider, documented by clinical staff.   Assessment and plan: Rash  - description of the rash is likely eczema variant or seborrheic dermatitis   - Bathe and soak for 10 minutes in warm water once a day. Pat dry.  Immediately apply the below ointment/cream prescribed to red/scaly/itchy/patchy areas only. Wait 5-10 minutes and then apply emollients like Lubriderm, CeraVe, Cetaphil, Aquaphor, Vaseline twice a day all over.   To red/scaly/itchy/patchy areas on the face and neck, apply: . Elidel 1% ointment twice a day as needed. . Be careful to avoid the eye itself. To red/scaly/itchy/patchy areas on  the body (below the face and neck), apply: . Triamcinolone 0.1 % ointment twice a day as needed (do not use in armpits or groin area) . May also use Elidel on the body as well which can be used in the armpits or groin area  - environmental allergy testing is positive to cat, molds, dust mites - food allergy testing is positive wheat, pork, [place], cabbage, watermelon.   We discussed being sensitized to foods vs being allergic today.  If you have noted worsening symptoms after eating wheat, pork, [place], cabbage or watermelon then you are allergic and should remove from the diet.  If you do not note any symptoms after eating these foods then you are sensitized and can keep in the diet.  At this time do not feel you warrant need of an epinephrine device.  If you develop symptoms of severe allergic reaction let us know.       Allergic rhinitis  - environmental allergy testing as above  - continue Nasacort 2 sprays each nostril daily as needed for nasal congestion.  Use for 1-2 weeks at a time before stopping for maximum benefit.    - if needed can take long-acting antihistamine like Zyrtec, Allegra or Xyzal   Follow-up 4 months or sooner if needed  I appreciate the opportunity to take part in Katrese's care. Please do not hesitate to contact me with questions.  Sincerely,   Prudy Feeler, MD Allergy/Immunology [facility]

## 2018-10-06 NOTE — Patient Instructions (Addendum)
Rash  - description of the rash is likely eczema variant or seborrheic dermatitis   - Bathe and soak for 10 minutes in warm water once a day. Pat dry.  Immediately apply the below ointment/cream prescribed to red/scaly/itchy/patchy areas only. Wait 5-10 minutes and then apply emollients like Lubriderm, CeraVe, Cetaphil, Aquaphor, Vaseline twice a day all over.   To red/scaly/itchy/patchy areas on the face and neck, apply: . Elidel 1% ointment twice a day as needed. . Be careful to avoid the eye itself. To red/scaly/itchy/patchy areas on the body (below the face and neck), apply: . Triamcinolone 0.1 % ointment twice a day as needed (do not use in armpits or groin area) . May also use Elidel on the body as well which can be used in the armpits or groin area  - environmental allergy testing is positive to cat, molds, dust mites - food allergy testing is positive wheat, pork, [place], cabbage, watermelon.   We discussed being sensitized to foods vs being allergic today.  If you have noted worsening symptoms after eating wheat, pork, [place], cabbage or watermelon then you are allergic and should remove from the diet.  If you do not note any symptoms after eating these foods then you are sensitized and can keep in the diet.  At this time do not feel you warrant need of an epinephrine device.  If you develop symptoms of severe allergic reaction let us know.       Allergies  - environmental allergy testing as above  - continue Nasacort 2 sprays each nostril daily as needed for nasal congestion.  Use for 1-2 weeks at a time before stopping for maximum benefit.    - if needed can take long-acting antihistamine like Zyrtec, Allegra or Xyzal  Follow-up 4 months or sooner if needed

## 2018-11-12 ENCOUNTER — Emergency Department (HOSPITAL_BASED_OUTPATIENT_CLINIC_OR_DEPARTMENT_OTHER)
Admission: EM | Admit: 2018-11-12 | Discharge: 2018-11-12 | Disposition: A | Discharge status: Home / Self Care | Payer: BC Managed Care – PPO | Attending: Emergency Medicine | Admitting: Emergency Medicine

## 2018-11-12 ENCOUNTER — Emergency Department (HOSPITAL_BASED_OUTPATIENT_CLINIC_OR_DEPARTMENT_OTHER): Payer: BC Managed Care – PPO

## 2018-11-12 ENCOUNTER — Encounter (HOSPITAL_BASED_OUTPATIENT_CLINIC_OR_DEPARTMENT_OTHER): Payer: Self-pay | Admitting: Emergency Medicine

## 2018-11-12 ENCOUNTER — Other Ambulatory Visit: Payer: Self-pay

## 2018-11-12 DIAGNOSIS — S060X0A Concussion without loss of consciousness, initial encounter: Secondary | ICD-10-CM

## 2018-11-12 DIAGNOSIS — W2209XA Striking against other stationary object, initial encounter: Secondary | ICD-10-CM | POA: Insufficient documentation

## 2018-11-12 DIAGNOSIS — Z79899 Other long term (current) drug therapy: Secondary | ICD-10-CM | POA: Insufficient documentation

## 2018-11-12 DIAGNOSIS — Y999 Unspecified external cause status: Secondary | ICD-10-CM | POA: Insufficient documentation

## 2018-11-12 DIAGNOSIS — R51 Headache: Secondary | ICD-10-CM | POA: Diagnosis present

## 2018-11-12 DIAGNOSIS — Y929 Unspecified place or not applicable: Secondary | ICD-10-CM | POA: Diagnosis not present

## 2018-11-12 DIAGNOSIS — Y9344 Activity, trampolining: Secondary | ICD-10-CM | POA: Insufficient documentation

## 2018-11-12 LAB — HCG, SERUM, QUALITATIVE: Preg, Serum: NEGATIVE

## 2018-11-12 LAB — BASIC METABOLIC PANEL
Anion gap: 10 (ref 5–15)
BUN: 12 mg/dL (ref 6–20)
CO2: 23 mmol/L (ref 22–32)
Calcium: 9.3 mg/dL (ref 8.9–10.3)
Chloride: 106 mmol/L (ref 98–111)
Creatinine, Ser: 0.77 mg/dL (ref 0.44–1.00)
GFR calc Af Amer: >60 mL/min (ref >60)
GFR calc non Af Amer: >60 mL/min (ref >60)
Glucose, Bld: 95 mg/dL (ref 70–99)
Potassium: 3.4 mmol/L — ABNORMAL LOW (ref 3.5–5.1)
Sodium: 139 mmol/L (ref 135–145)

## 2018-11-12 LAB — CBC WITH DIFFERENTIAL/PLATELET
Abs Immature Granulocytes: 0.02 10*3/uL (ref 0.00–0.07)
Basophils Absolute: 0 10*3/uL (ref 0.0–0.1)
Basophils Relative: 0 %
Eosinophils Absolute: 0.1 10*3/uL (ref 0.0–0.5)
Eosinophils Relative: 1 %
HCT: 43.3 % (ref 36.0–46.0)
Hemoglobin: 13.8 g/dL (ref 12.0–15.0)
Immature Granulocytes: 0 %
Lymphocytes Relative: 22 %
Lymphs Abs: 1.4 10*3/uL (ref 0.7–4.0)
MCH: 30.5 pg (ref 26.0–34.0)
MCHC: 31.9 g/dL (ref 30.0–36.0)
MCV: 95.6 fL (ref 80.0–100.0)
Monocytes Absolute: 0.3 10*3/uL (ref 0.1–1.0)
Monocytes Relative: 5 %
Neutro Abs: 4.6 10*3/uL (ref 1.7–7.7)
Neutrophils Relative %: 72 %
Platelets: 218 10*3/uL (ref 150–400)
RBC: 4.53 MIL/uL (ref 3.87–5.11)
RDW: 11.9 % (ref 11.5–15.5)
WBC: 6.4 10*3/uL (ref 4.0–10.5)
nRBC: 0 % (ref 0.0–0.2)

## 2018-11-12 NOTE — Discharge Instructions (Addendum)
1.  Follow-up with your neurologist for persistent postconcussive symptoms. 2.  Try to rest and decrease any screen time on your computer or phone. 3.  Return to the emergency department if your symptoms are worsening or progressing.

## 2018-11-12 NOTE — ED Provider Notes (Signed)
[facility] EMERGENCY DEPARTMENT Provider Note   CSN: [id number] Arrival date & time: 11/12/18  1243     History   Chief Complaint Chief Complaint  Patient presents with  . Head Injury    HPI Wendy Lin is a 43 y.o. female.     HPI Patient was struck in the back of the head on 8/25 while jumping on the trampoline.  No loss of consciousness though she did feel lightheaded.  States she has had persistent left posterior headache and over the last few days has developed nausea and dizziness.  She describes the dizziness as feeling like she is going to pass out.  She denies any visual changes.  No focal weakness or numbness.  She does have some ringing in her ears left greater than right.  No other recent head injury.  Think she may have sustained a concussion when she was a child. Past Medical History:  Diagnosis Date  . Abnormal Pap smear    ASCUS, low grade squamous intraepithelial lesion  . AMA (advanced maternal age) multigravida 34+   . Blood transfusion complicating pregnancy 4098   s/p uterine atony  . Low-lying placenta   . Postpartum care following vaginal delivery (9/12) 11/20/2010    Patient Active Problem List   Diagnosis Date Noted  . Postpartum care following vaginal delivery (9/12) 11/20/2010    Past Surgical History:  Procedure Laterality Date  . COLPOSCOPY  2017  . KNEE SURGERY  1995  . MOUTH SURGERY  1994  . WISDOM TOOTH EXTRACTION       OB History    Gravida  2   Para  2   Term  2   Preterm      AB      Living  2     SAB      TAB      Ectopic      Multiple      Live Births  2            Home Medications    Prior to Admission medications   Medication Sig Start Date End Date Taking? Authorizing Provider  Dextran 70-Hypromellose (ARTIFICIAL TEARS PF OP) Apply to eye.    [provider]  levonorgestrel (MIRENA) 20 MCG/24HR IUD by Intrauterine route.    [provider]  neomycin-polymyxin  b-dexamethasone (MAXITROL) 3.5-10000-0.1 OINT  02/24/18   [provider]  pimecrolimus (ELIDEL) 1 % cream Apply topically 2 (two) times daily. 10/06/18   Kennith Gain, MD  triamcinolone (NASACORT) 55 MCG/ACT AERO nasal inhaler Place 2 sprays into the nose daily.    [provider]  triamcinolone cream (KENALOG) 0.1 % APPLY 1 APPLICATION ON THE SKIN TWICE A DAY AS NEEDED FOR FLARES NOT TO BE APPLIED TO FACE 05/07/18   [provider]  triamcinolone ointment (KENALOG) 0.1 % Apply 1 application topically 2 (two) times daily. 10/06/18   Kennith Gain, MD    Family History Family History  Problem Relation Age of Onset  . Mitral valve prolapse Mother   . Seizures Mother        in pregnancy  . Heart disease Mother   . Cancer Mother        breast  . Vision loss Father        glacoma  . Mitral valve prolapse Sister   . Depression Sister   . Migraines Sister   . Mental illness Sister   . Mitral valve prolapse Maternal Uncle   .  Heart disease Maternal Uncle   . Heart disease Maternal Grandmother   . COPD Maternal Grandfather   . Vision loss Maternal Grandfather        glacoma  . Birth defects Maternal Aunt        Breast  . Cancer Paternal Grandfather        prostate ca    Social History Social History   Tobacco Use  . Smoking status: Never Smoker  . Smokeless tobacco: Never Used  Substance Use Topics  . Alcohol use: Yes    Comment: SOCIAL  . Drug use: No     Allergies   Codeine   Review of Systems Review of Systems  Constitutional: Negative for chills and fever.  HENT: Negative for sinus pressure and trouble swallowing.   Eyes: Negative for visual disturbance.  Respiratory: Negative for cough and shortness of breath.   Cardiovascular: Negative for chest pain.  Gastrointestinal: Positive for nausea. Negative for abdominal pain, constipation, diarrhea and vomiting.  Musculoskeletal: Negative for back pain, myalgias and  neck pain.  Skin: Negative for rash and wound.  Neurological: Positive for dizziness, light-headedness and headaches. Negative for seizures, syncope, weakness and numbness.  All other systems reviewed and are negative.    Physical Exam Updated Vital Signs BP 124/89 (BP Location: Left Arm)   Pulse 79   Temp 98.2 F (36.8 C) (Oral)   Ht 5' 6"  (1.676 m)   Wt 59 kg   SpO2 100%   BMI 20.98 kg/m   Physical Exam Vitals signs and nursing note reviewed.  Constitutional:      Appearance: Normal appearance. She is well-developed.  HENT:     Head: Normocephalic and atraumatic.     Comments: Mild tenderness to palpation in the left occipital region.  No obvious injury.  Bilateral TMs are normal.  No postauricular or periorbital ecchymosis.    Nose: Nose normal.     Mouth/Throat:     Mouth: Mucous membranes are moist.  Eyes:     Extraocular Movements: Extraocular movements intact.     Pupils: Pupils are equal, round, and reactive to light.     Comments: No nystagmus present  Neck:     Musculoskeletal: Normal range of motion and neck supple. No neck rigidity or muscular tenderness.     Comments: No posterior midline cervical tenderness to palpation. Cardiovascular:     Rate and Rhythm: Normal rate and regular rhythm.  Pulmonary:     Effort: Pulmonary effort is normal.     Breath sounds: Normal breath sounds.  Abdominal:     General: Bowel sounds are normal.     Palpations: Abdomen is soft.     Tenderness: There is no abdominal tenderness. There is no guarding or rebound.  Musculoskeletal: Normal range of motion.        General: No swelling, tenderness, deformity or signs of injury.     Right lower leg: No edema.     Left lower leg: No edema.  Lymphadenopathy:     Cervical: No cervical adenopathy.  Skin:    General: Skin is warm and dry.     Findings: No erythema or rash.  Neurological:     General: No focal deficit present.     Mental Status: She is alert and oriented to  person, place, and time.     Comments: Patient is alert and oriented x3 with clear, goal oriented speech. Patient has 5/5 motor in all extremities. Sensation is intact to light touch. Patient  has a normal gait and walks without assistance.  Psychiatric:        Behavior: Behavior normal.      ED Treatments / Results  Labs (all labs ordered are listed, but only abnormal results are displayed) Labs Reviewed  CBC WITH DIFFERENTIAL/PLATELET  BASIC METABOLIC PANEL  HCG, SERUM, QUALITATIVE    EKG None  Radiology No results found.  Procedures Procedures (including critical care time)  Medications Ordered in ED Medications - No data to display   Initial Impression / Assessment and Plan / ED Course  I have reviewed the triage vital signs and the nursing notes.  Pertinent labs & imaging results that were available during my care of the patient were reviewed by me and considered in my medical decision making (see chart for details).        Suspect possible postconcussion syndrome.  Will screen with labs.  Shared decision making with patient regarding obtaining CT of her head.  Decided she would like to get the scan.  Will sign out to oncoming emergency physician.  Final Clinical Impressions(s) / ED Diagnoses   Final diagnoses:  Concussion without loss of consciousness, initial encounter    ED Discharge Orders    None       Julianne Rice, MD 11/12/18 1451

## 2018-11-12 NOTE — ED Triage Notes (Signed)
Pt c/o headache since 8/25 after being kneed in head on trampoline. Denies loc, c/o nausea, and dizziness.

## 2018-12-16 ENCOUNTER — Encounter: Payer: BLUE CROSS/BLUE SHIELD | Admitting: Obstetrics & Gynecology

## 2019-01-04 ENCOUNTER — Other Ambulatory Visit: Payer: Self-pay

## 2019-01-05 ENCOUNTER — Encounter: Payer: Self-pay | Admitting: Obstetrics & Gynecology

## 2019-01-05 ENCOUNTER — Ambulatory Visit (INDEPENDENT_AMBULATORY_CARE_PROVIDER_SITE_OTHER): Payer: BC Managed Care – PPO | Admitting: Obstetrics & Gynecology

## 2019-01-05 VITALS — BP 112/76 | Ht 66.0 in | Wt 134.0 lb

## 2019-01-05 DIAGNOSIS — R300 Dysuria: Secondary | ICD-10-CM

## 2019-01-05 DIAGNOSIS — Z01419 Encounter for gynecological examination (general) (routine) without abnormal findings: Secondary | ICD-10-CM

## 2019-01-05 DIAGNOSIS — Z30431 Encounter for routine checking of intrauterine contraceptive device: Secondary | ICD-10-CM | POA: Diagnosis not present

## 2019-01-05 LAB — URINALYSIS, COMPLETE W/RFL CULTURE
Bacteria, UA: NONE SEEN /HPF
Bilirubin Urine: NEGATIVE
Glucose, UA: NEGATIVE
Hgb urine dipstick: NEGATIVE
Hyaline Cast: NONE SEEN /LPF
Ketones, ur: NEGATIVE
Leukocyte Esterase: NEGATIVE
Nitrites, Initial: NEGATIVE
Protein, ur: NEGATIVE
RBC / HPF: NONE SEEN /HPF (ref 0–2)
Specific Gravity, Urine: 1.015 (ref 1.001–1.03)
WBC, UA: NONE SEEN /HPF (ref 0–5)
pH: 5.5 (ref 5.0–8.0)

## 2019-01-05 LAB — NO CULTURE INDICATED

## 2019-01-05 NOTE — Progress Notes (Signed)
Wendy Lin January 18, 1976 [id number]   History:    43 y.o. G2P2L2 Boyfriend x 3 yrs.  Children:  Son 7th grade at [employer].  Daughter 2nd grade.  RP:  Established patient presenting for annual gyn exam   HPI: Well on Mirena IUD x 02/2016.  No BTB.  No pelvic pain.  Normal vaginal secretions.  No pain with IC.  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  HPV 16-18-45 neg.  Pap neg/HPV HR neg 10/2016. Urine currently normal.  Had pain with urination 2 weeks ago, which resolved with increased hydration.  BMs wnl. Breasts wnl.  BMI 21.63.  Fit and healthy nutrition.  Health labs with Fam MD.  Past medical history,surgical history, family history and social history were all reviewed and documented in the EPIC chart.  Gynecologic History No LMP recorded. (Menstrual status: IUD). Contraception: Mirena IUD x 2017 Last Pap: 12/2017. Results were: Negative/HPV HR neg.  CIN 1 in 03/2016. Last mammogram: 04/2018. Results were: Benign Bone Density: Never Colonoscopy: Never  Obstetric History OB History  Gravida Para Term Preterm AB Living  2 2 2     2   SAB TAB Ectopic Multiple Live Births          2    # Outcome Date GA Lbr Len/2nd Weight Sex Delivery Anes PTL Lv  2 Term 11/19/10 51w5d04:10 / 00:22 7 lb 15.3 oz (3.609 kg) M Vag-Spont EPI  LIV     Birth Comments: None  1 Term 2007 482w0d7 lb 8 oz (3.402 kg) M Vag-Spont EPI  LIV     ROS: A ROS was performed and pertinent positives and negatives are included in the history.  GENERAL: No fevers or chills. HEENT: No change in vision, no earache, sore throat or sinus congestion. NECK: No pain or stiffness. CARDIOVASCULAR: No chest pain or pressure. No palpitations. PULMONARY: No shortness of breath, cough or wheeze. GASTROINTESTINAL: No abdominal pain, nausea, vomiting or diarrhea, melena or bright red blood per rectum. GENITOURINARY: No urinary frequency, urgency, hesitancy or dysuria. MUSCULOSKELETAL: No joint or muscle pain, no back pain, no recent trauma. DERMATOLOGIC: No  rash, no itching, no lesions. ENDOCRINE: No polyuria, polydipsia, no heat or cold intolerance. No recent change in weight. HEMATOLOGICAL: No anemia or easy bruising or bleeding. NEUROLOGIC: No headache, seizures, numbness, tingling or weakness. PSYCHIATRIC: No depression, no loss of interest in normal activity or change in sleep pattern.     Exam:   BP 112/76   Ht 5' 6"  (1.676 m)   Wt 134 lb (60.8 kg)   BMI 21.63 kg/m   Body mass index is 21.63 kg/m.  General appearance : Well developed well nourished female. No acute distress HEENT: Eyes: no retinal hemorrhage or exudates,  Neck supple, trachea midline, no carotid bruits, no thyroidmegaly Lungs: Clear to auscultation, no rhonchi or wheezes, or rib retractions  Heart: Regular rate and rhythm, no murmurs or gallops Breast:Examined in sitting and supine position were symmetrical in appearance, no palpable masses or tenderness,  no skin retraction, no nipple inversion, no nipple discharge, no skin discoloration, no axillary or supraclavicular lymphadenopathy Abdomen: no palpable masses or tenderness, no rebound or guarding Extremities: no edema or skin discoloration or tenderness  Pelvic: Vulva: Normal             Vagina: No gross lesions or discharge.  [facility] Cervix: No gross lesions or discharge. Pap reflex done.  Uterus  AV, normal size, shape and consistency, non-tender and  mobile  Adnexa  Without masses or tenderness  Anus: Normal  U/A: Negative   Assessment/Plan:  43 y.o. female for annual exam   1. Encounter for routine gynecological examination with Papanicolaou smear of cervix Normal gynecologic exam.  Pap reflex done.  Breast exam normal.  Screening mammogram in February 2020 was benign.  Health labs with family physician.  2. Encounter for routine checking of intrauterine contraceptive device (IUD) Mirena IUD in good position and well-tolerated.  Insertion of the IUD was in 2017.  3. Dysuria U/A  negative. - Urinalysis,Complete w/RFL Culture  Princess Bruins MD, 12:10 PM 01/05/2019

## 2019-01-06 LAB — PAP IG W/ RFLX HPV ASCU

## 2019-01-09 ENCOUNTER — Encounter: Payer: Self-pay | Admitting: Obstetrics & Gynecology

## 2019-01-09 NOTE — Patient Instructions (Signed)
1. Encounter for routine gynecological examination with Papanicolaou smear of cervix Normal gynecologic exam.  Pap reflex done.  Breast exam normal.  Screening mammogram in February 2020 was benign.  Health labs with family physician.  2. Encounter for routine checking of intrauterine contraceptive device (IUD) Mirena IUD in good position and well-tolerated.  Insertion of the IUD was in 2017.  3. Dysuria U/A negative. - Urinalysis,Complete w/RFL Culture  Jacy, it was a pleasure seeing you today!  I will inform you of your results as soon as they are available.

## 2019-11-02 IMAGING — CT CT HEAD W/O CM
3 series · 16 of 47 positions shown, 19 images · non-contrast
Comparison: None.

CLINICAL DATA: 11/01/18 had injury on trampoline- knee to posterior
aspect of head,having headache and dizziness at times

EXAM:
CT HEAD WITHOUT CONTRAST
TECHNIQUE: Contiguous axial images were obtained from the base of the skull
through the vertex without intravenous contrast.

[Series 2: head wo · axial · 0.46mm/px · z∈[+870,+1006]mm · 10 of 33 slices shown, 13 images]
[im 3/33  brain]
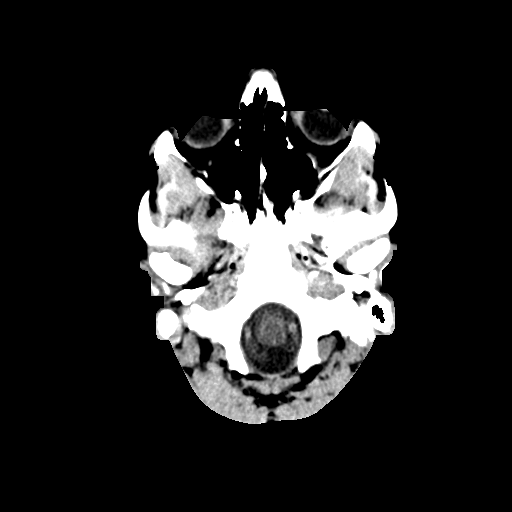
[im 3/33  bone]
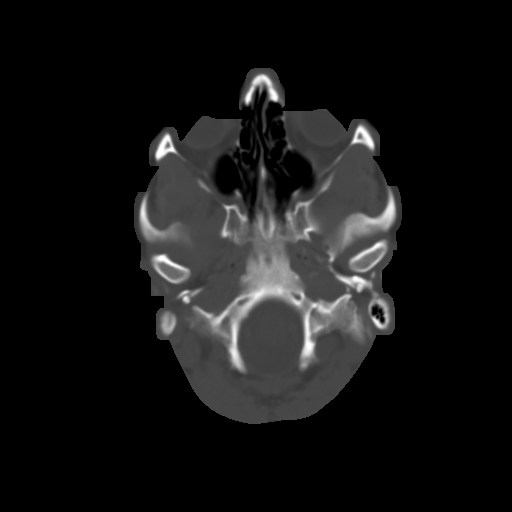
[im 6/33  brain]
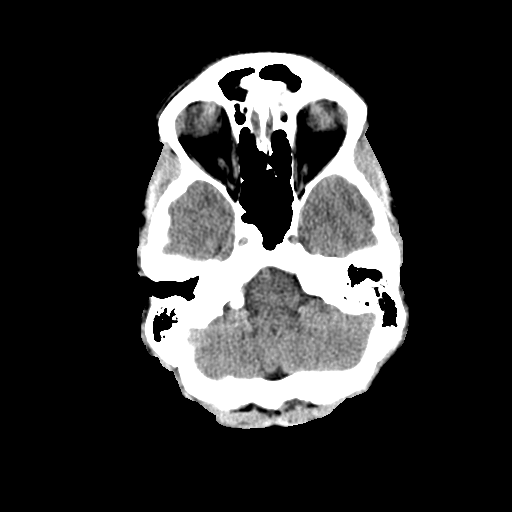
[im 9/33  brain]
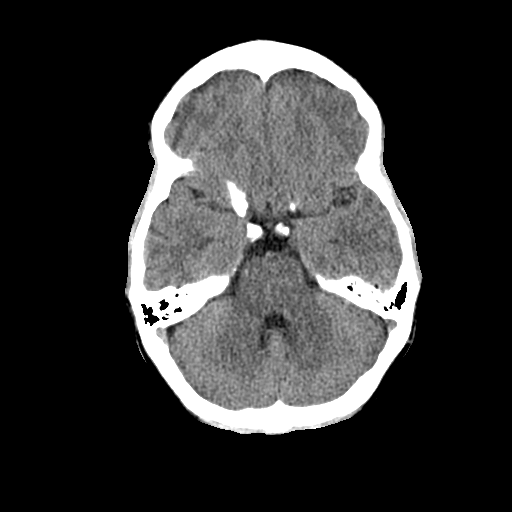
[im 12/33  brain]
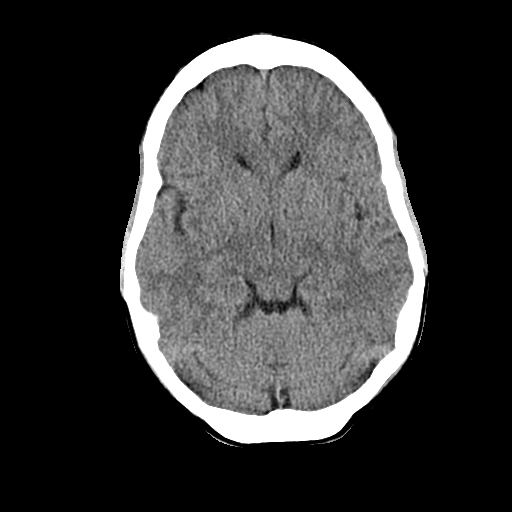
[im 15/33  brain]
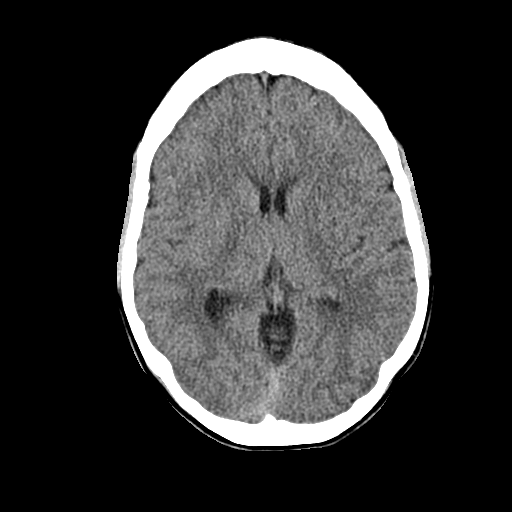
[im 15/33  bone]
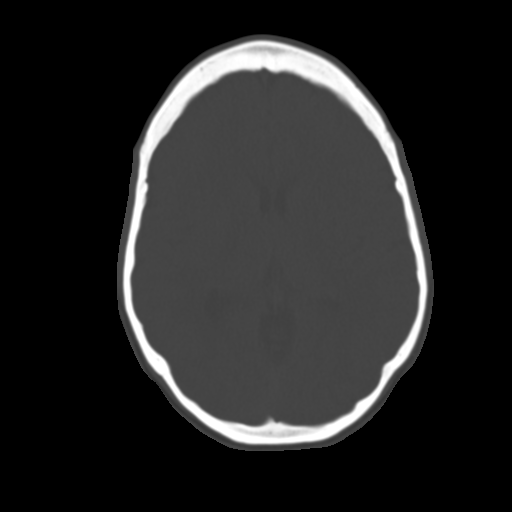
[im 18/33  brain]
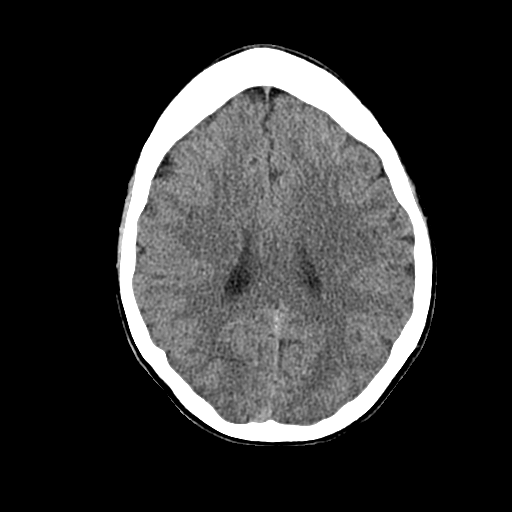
[im 21/33  brain]
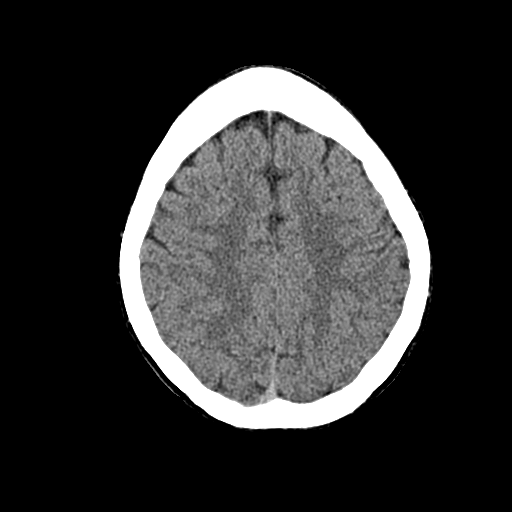
[im 25/33  brain]
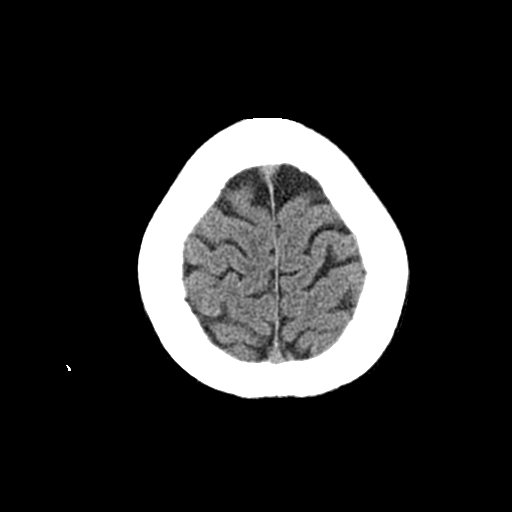
[im 27/33  brain]
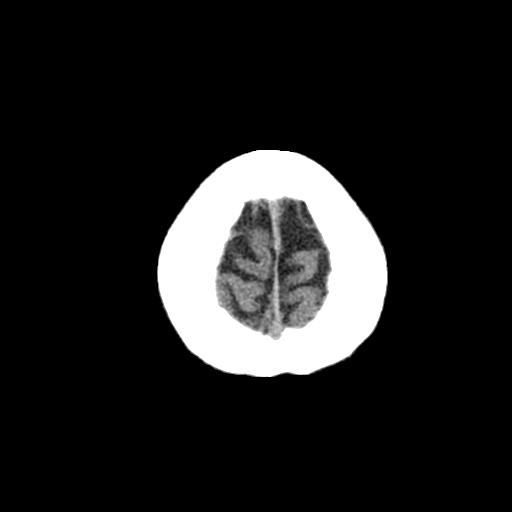
[im 27/33  bone]
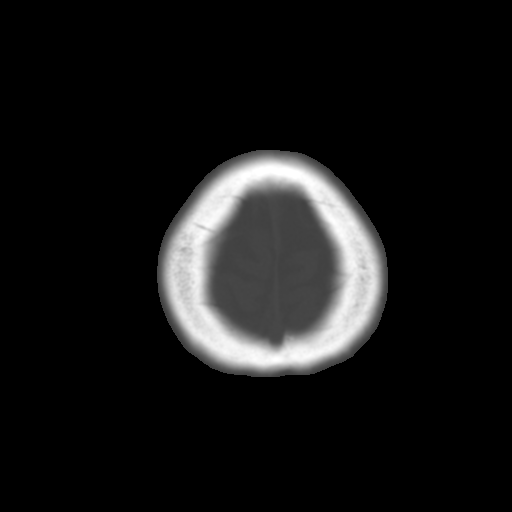
[im 30/33  brain]
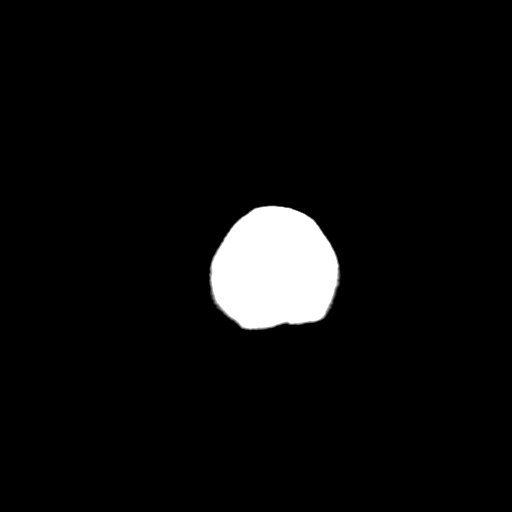

[Series 4: cor soft · coronal · 0.31mm/px · 3 of 68 slices shown]
[im 23/68  brain]
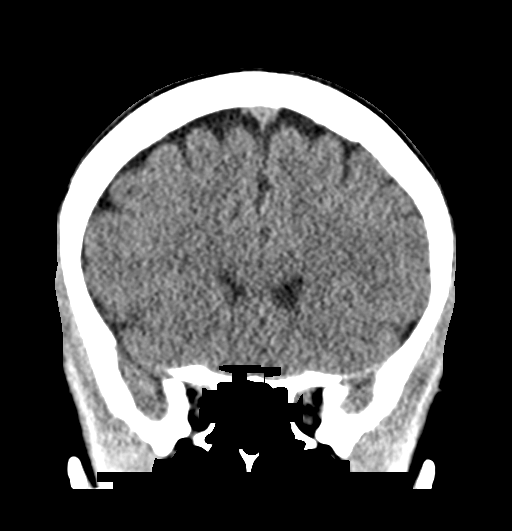
[im 30/68  brain]
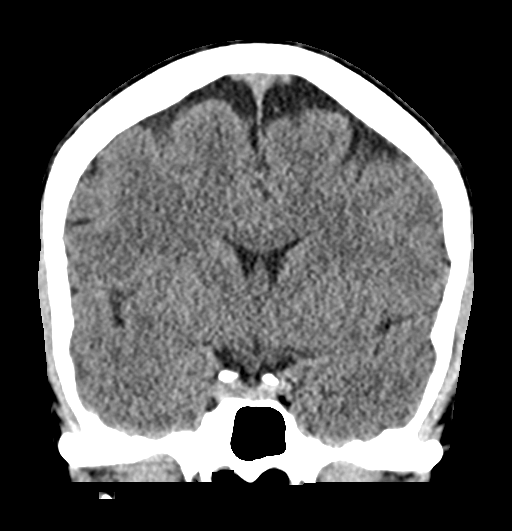
[im 38/68  brain]
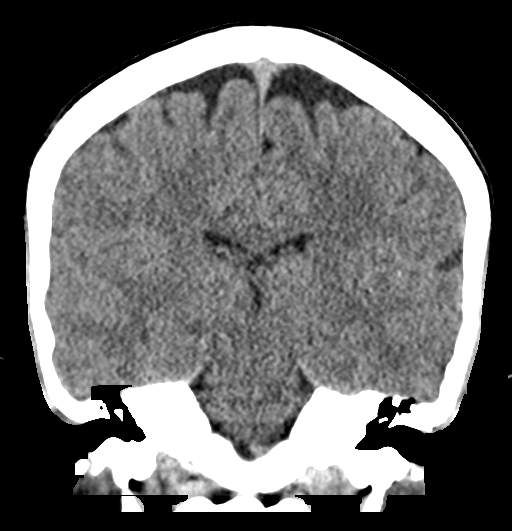

[Series 5: sag soft · sagittal · 0.33mm/px · 3 of 53 slices shown]
[im 18/53  brain]
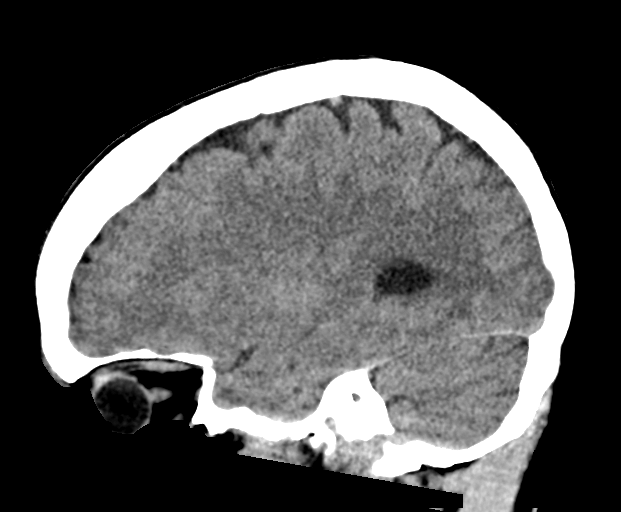
[im 27/53  brain]
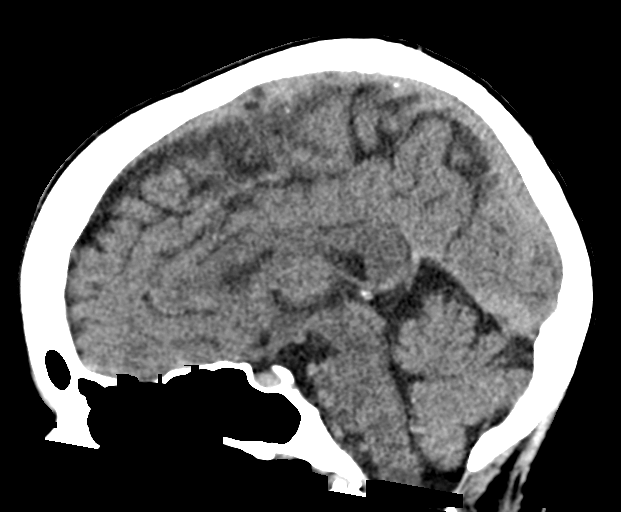
[im 35/53  brain]
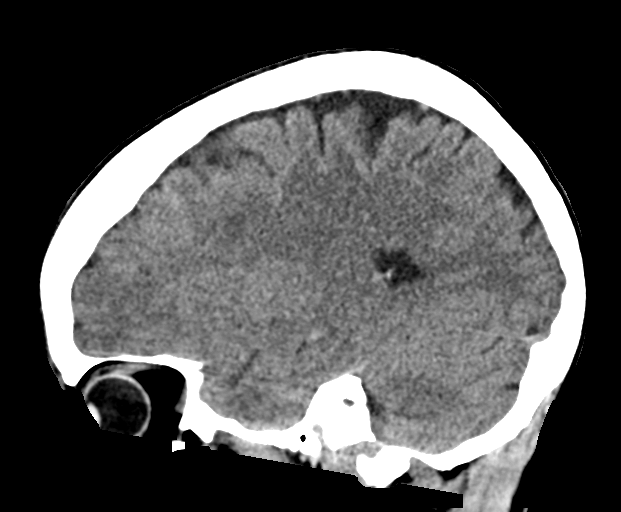

[16 of 47 positions shown; findings below may reference images not displayed]

FINDINGS: Brain: No evidence of acute infarction, hemorrhage, hydrocephalus,
extra-axial collection or mass lesion/mass effect.

Vascular: No hyperdense vessel or unexpected calcification.

Skull: Normal. Negative for fracture or focal lesion.

Sinuses/Orbits: No acute finding.

Other: None.
IMPRESSION: No acute intracranial process.

## 2020-01-29 ENCOUNTER — Encounter: Payer: BC Managed Care – PPO | Admitting: Obstetrics & Gynecology

## 2020-02-08 ENCOUNTER — Other Ambulatory Visit: Payer: Self-pay

## 2020-02-08 ENCOUNTER — Ambulatory Visit (INDEPENDENT_AMBULATORY_CARE_PROVIDER_SITE_OTHER): Payer: BC Managed Care – PPO | Admitting: Obstetrics & Gynecology

## 2020-02-08 ENCOUNTER — Encounter: Payer: Self-pay | Admitting: Obstetrics & Gynecology

## 2020-02-08 VITALS — BP 118/70 | Ht 65.75 in | Wt 134.0 lb

## 2020-02-08 DIAGNOSIS — Z30431 Encounter for routine checking of intrauterine contraceptive device: Secondary | ICD-10-CM

## 2020-02-08 DIAGNOSIS — Z01419 Encounter for gynecological examination (general) (routine) without abnormal findings: Secondary | ICD-10-CM

## 2020-02-08 NOTE — Progress Notes (Signed)
Wendy Lin 14-Nov-1975 [id number]   History:    44 y.o. G2P2L2 Boyfriend x 4 yrs. Children: Son 8th grade at [employer]. Daughter 3nd grade.  LK:[id number] presenting for annual gyn exam   VVZ:SMOL on Mirena IUD x 02/2016. No BTB. No pelvic pain. Normal vaginal secretions. No pain with IC. 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 HPV 16-18-45 neg. Pap neg 12/2018. Urine/ BMs wnl. Breasts wnl. BMI 21.79. Fit and healthy nutrition. Health labs with Fam MD.  Past medical history,surgical history, family history and social history were all reviewed and documented in the EPIC chart.  Gynecologic History No LMP recorded. (Menstrual status: IUD).  Obstetric History OB History  Gravida Para Term Preterm AB Living  2 2 2     2   SAB TAB Ectopic Multiple Live Births          2    # Outcome Date GA Lbr Len/2nd Weight Sex Delivery Anes PTL Lv  2 Term 11/19/10 41w5d04:10 / 00:22 7 lb 15.3 oz (3.609 kg) M Vag-Spont EPI  LIV     Birth Comments: None  1 Term 2007 427w0d7 lb 8 oz (3.402 kg) M Vag-Spont EPI  LIV     ROS: A ROS was performed and pertinent positives and negatives are included in the history.  GENERAL: No fevers or chills. HEENT: No change in vision, no earache, sore throat or sinus congestion. NECK: No pain or stiffness. CARDIOVASCULAR: No chest pain or pressure. No palpitations. PULMONARY: No shortness of breath, cough or wheeze. GASTROINTESTINAL: No abdominal pain, nausea, vomiting or diarrhea, melena or bright red blood per rectum. GENITOURINARY: No urinary frequency, urgency, hesitancy or dysuria. MUSCULOSKELETAL: No joint or muscle pain, no back pain, no recent trauma. DERMATOLOGIC: No rash, no itching, no lesions. ENDOCRINE: No polyuria, polydipsia, no heat or cold intolerance. No recent change in weight. HEMATOLOGICAL: No anemia or easy bruising or bleeding. NEUROLOGIC: No headache, seizures, numbness, tingling or weakness. PSYCHIATRIC: No depression, no loss of interest in  normal activity or change in sleep pattern.     Exam:   BP 118/70   Ht 5' 5.75" (1.67 m)   Wt 134 lb (60.8 kg)   BMI 21.79 kg/m   Body mass index is 21.79 kg/m.  General appearance : Well developed well nourished female. No acute distress HEENT: Eyes: no retinal hemorrhage or exudates,  Neck supple, trachea midline, no carotid bruits, no thyroidmegaly Lungs: Clear to auscultation, no rhonchi or wheezes, or rib retractions  Heart: Regular rate and rhythm, no murmurs or gallops Breast:Examined in sitting and supine position were symmetrical in appearance, no palpable masses or tenderness,  no skin retraction, no nipple inversion, no nipple discharge, no skin discoloration, no axillary or supraclavicular lymphadenopathy Abdomen: no palpable masses or tenderness, no rebound or guarding Extremities: no edema or skin discoloration or tenderness  Pelvic: Vulva: Normal             Vagina: No gross lesions or discharge  Cervix: No gross lesions or discharge. [facility] done.  Uterus  AV, normal size, shape and consistency, non-tender and mobile  Adnexa  Without masses or tenderness  Anus: Normal   Assessment/Plan:  [age].o. female for annual exam   1. Encounter for routine gynecological examination with Papanicolaou smear of cervix Normal gynecologic exam.  Pap reflex done.  Breast exam normal.  Screening mammogram January 2021 was negative.  Health labs with family physician.  Good body mass index at  21.79.  Continue with fitness and healthy nutrition.  2. Encounter for routine checking of intrauterine contraceptive device (IUD) Well on Mirena IUD since December 2017.  IUD in good position.  Princess Bruins MD, 12:41 PM 02/08/2020

## 2020-02-09 ENCOUNTER — Encounter: Payer: Self-pay | Admitting: Obstetrics & Gynecology

## 2020-02-12 LAB — PAP IG W/ RFLX HPV ASCU

## 2020-02-13 ENCOUNTER — Other Ambulatory Visit: Payer: Self-pay

## 2020-02-13 MED ORDER — FLUCONAZOLE 150 MG PO TABS: ORAL_TABLET | ORAL | 0 refills | Status: DC

## 2020-03-20 LAB — COLOGUARD: COLOGUARD: NEGATIVE

## 2020-03-20 LAB — EXTERNAL GENERIC LAB PROCEDURE: COLOGUARD: NEGATIVE

## 2020-12-26 ENCOUNTER — Other Ambulatory Visit: Payer: Self-pay

## 2020-12-26 ENCOUNTER — Ambulatory Visit (INDEPENDENT_AMBULATORY_CARE_PROVIDER_SITE_OTHER): Payer: 59 | Admitting: Obstetrics & Gynecology

## 2020-12-26 ENCOUNTER — Encounter: Payer: Self-pay | Admitting: Obstetrics & Gynecology

## 2020-12-26 ENCOUNTER — Telehealth: Payer: Self-pay | Admitting: *Deleted

## 2020-12-26 VITALS — BP 104/76

## 2020-12-26 DIAGNOSIS — N644 Mastodynia: Secondary | ICD-10-CM

## 2020-12-26 DIAGNOSIS — R922 Inconclusive mammogram: Secondary | ICD-10-CM

## 2020-12-26 NOTE — Progress Notes (Signed)
    Wendy Lin 1975-05-01 [id number]        45 y.o.  G2P2L2 Married  RP: Left breast pain worsening x 1 week  HPI: Left breast pain on-off x 1 year.  Worsening pain x 1 week.  Known dense breasts per previous Mammo.  Last screening mammo at [facility] 04/2020 was normal per patient.  No fam H/O Breast Ca.   OB History  Gravida Para Term Preterm AB Living  2 2 2     2   SAB IAB Ectopic Multiple Live Births          2    # Outcome Date GA Lbr Len/2nd Weight Sex Delivery Anes PTL Lv  2 Term 11/19/10 76w5d04:10 / 00:22 7 lb 15.3 oz (3.609 kg) M Vag-Spont EPI  LIV     Birth Comments: None  1 Term 2007 462w0d7 lb 8 oz (3.402 kg) M Vag-Spont EPI  LIV    Past medical history,surgical history, problem list, medications, allergies, family history and social history were all reviewed and documented in the EPIC chart.   Directed ROS with pertinent positives and negatives documented in the history of present illness/assessment and plan.  Exam:  Vitals:   12/26/20 1208  BP: 104/76   General appearance:  Normal  Breast exam:  Rt breast normal.  No Rt axillary LN felt.                        Lt breast:  Very dense large area from 12 to 3 O'clock near the nipple.  Breast mildly tender.  Skin normal.  No nipple d/c.  Left axilla with no LN fett.   Assessment/Plan:  [age].o. G2P2002   1. Pain of left breast Left breast pain on-off x 1 year, worsening x 1 week.  Will investigate further with a Lt Dx mammo/Lt breast [place] 2. Dense left breast tissue  Very dense large area from 12 to 3 O'clock near the nipple.  Counseling done on breast pain and dense breasts.  Will investigate further with a Lt Dx mammo/Lt breast [place] r/o breast Ca.  [facility]   MaPrincess BruinsD, 12:26 PM 12/26/2020

## 2020-12-26 NOTE — Telephone Encounter (Signed)
Pt complains of left  breast pain, severity of pain 5 to 6. Pt would like to make an appointment to be evaluated. [facility] appointment pool aware to call and set appointment up.

## 2020-12-27 ENCOUNTER — Telehealth: Payer: Self-pay | Admitting: *Deleted

## 2020-12-27 DIAGNOSIS — N644 Mastodynia: Secondary | ICD-10-CM

## 2020-12-27 NOTE — Telephone Encounter (Signed)
-----   Message from Princess Bruins, MD sent at 12/26/2020 12:28 PM EDT ----- Regarding: Refer to the [facility] for a Left Dx mammo/Breast US Left breast pain and left breast with very dense large area from 12 to 3 O'Clock near the nipple.

## 2020-12-27 NOTE — Telephone Encounter (Signed)
Order placed at the breast center,patient scheduled on 02/05/21  @ 9:40am. Patient aware to call daily/weekly to check for any cancellations.

## 2021-02-05 ENCOUNTER — Ambulatory Visit
Admission: RE | Admit: 2021-02-05 | Discharge: 2021-02-05 | Disposition: A | Discharge status: Home / Self Care | Payer: 59 | Source: Ambulatory Visit | Attending: Obstetrics & Gynecology | Admitting: Obstetrics & Gynecology

## 2021-02-05 ENCOUNTER — Ambulatory Visit
Admission: RE | Admit: 2021-02-05 | Discharge: 2021-02-05 | Disposition: A | Discharge status: Home / Self Care | Payer: Self-pay | Source: Ambulatory Visit | Attending: Obstetrics & Gynecology | Admitting: Obstetrics & Gynecology

## 2021-02-05 DIAGNOSIS — N644 Mastodynia: Secondary | ICD-10-CM

## 2021-03-21 ENCOUNTER — Encounter: Payer: Self-pay | Admitting: Obstetrics & Gynecology

## 2021-03-21 ENCOUNTER — Other Ambulatory Visit (HOSPITAL_COMMUNITY)
Admission: RE | Admit: 2021-03-21 | Discharge: 2021-03-21 | Disposition: A | Discharge status: Home / Self Care | Payer: Self-pay | Source: Ambulatory Visit | Attending: Obstetrics & Gynecology | Admitting: Obstetrics & Gynecology

## 2021-03-21 ENCOUNTER — Ambulatory Visit (INDEPENDENT_AMBULATORY_CARE_PROVIDER_SITE_OTHER): Payer: 59 | Admitting: Obstetrics & Gynecology

## 2021-03-21 ENCOUNTER — Other Ambulatory Visit: Payer: Self-pay

## 2021-03-21 VITALS — BP 110/64 | Ht 66.0 in | Wt 139.0 lb

## 2021-03-21 DIAGNOSIS — N870 Mild cervical dysplasia: Secondary | ICD-10-CM | POA: Diagnosis not present

## 2021-03-21 DIAGNOSIS — Z01419 Encounter for gynecological examination (general) (routine) without abnormal findings: Secondary | ICD-10-CM | POA: Insufficient documentation

## 2021-03-21 DIAGNOSIS — Z30431 Encounter for routine checking of intrauterine contraceptive device: Secondary | ICD-10-CM

## 2021-03-21 NOTE — Progress Notes (Signed)
Wendy Lin [birth year] [id number]   History:    46 y.o. G2P2L2 Boyfriend x 5 yrs.  Children:  Son Levora Dredge in HS (6'1" tall).  Daughter 4th grade.   RP:  Established patient presenting for annual gyn exam    HPI: Well on Mirena IUD x 02/2016.  No BTB.  No pelvic pain.  Normal vaginal secretions.  No pain with IC.  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  HPV 16-18-45 neg.  Pap neg 02/2020.  Will repeat Pap today. Urine/ BMs wnl. Breasts wnl. Mammo 12/2020, Dx mammo/US 02/05/2021 Benign Cyst.  BMI 22.44.  Fit and healthy nutrition.  Health labs with Fam MD.  ColoGard Neg 2022.   Past medical history,surgical history, family history and social history were all reviewed and documented in the EPIC chart.  Gynecologic History No LMP recorded. (Menstrual status: IUD).  Obstetric History OB History  Gravida Para Term Preterm AB Living  2 2 2     2   SAB IAB Ectopic Multiple Live Births          2    # Outcome Date GA Lbr Len/2nd Weight Sex Delivery Anes PTL Lv  2 Term 11/19/10 24w5d04:10 / 00:22 7 lb 15.3 oz (3.609 kg) M Vag-Spont EPI  LIV     Birth Comments: None  1 Term 2007 478w0d7 lb 8 oz (3.402 kg) M Vag-Spont EPI  LIV     ROS: A ROS was performed and pertinent positives and negatives are included in the history.  GENERAL: No fevers or chills. HEENT: No change in vision, no earache, sore throat or sinus congestion. NECK: No pain or stiffness. CARDIOVASCULAR: No chest pain or pressure. No palpitations. PULMONARY: No shortness of breath, cough or wheeze. GASTROINTESTINAL: No abdominal pain, nausea, vomiting or diarrhea, melena or bright red blood per rectum. GENITOURINARY: No urinary frequency, urgency, hesitancy or dysuria. MUSCULOSKELETAL: No joint or muscle pain, no back pain, no recent trauma. DERMATOLOGIC: No rash, no itching, no lesions. ENDOCRINE: No polyuria, polydipsia, no heat or cold intolerance. No recent change in weight. HEMATOLOGICAL: No anemia or easy bruising or bleeding. NEUROLOGIC: No  headache, seizures, numbness, tingling or weakness. PSYCHIATRIC: No depression, no loss of interest in normal activity or change in sleep pattern.     Exam:   BP 110/64    Ht 5' 6"  (1.676 m)    Wt 139 lb (63 kg)    BMI 22.44 kg/m   Body mass index is 22.44 kg/m.  General appearance : Well developed well nourished female. No acute distress HEENT: Eyes: no retinal hemorrhage or exudates,  Neck supple, trachea midline, no carotid bruits, no thyroidmegaly Lungs: Clear to auscultation, no rhonchi or wheezes, or rib retractions  Heart: Regular rate and rhythm, no murmurs or gallops Breast:Examined in sitting and supine position were symmetrical in appearance, no palpable masses or tenderness,  no skin retraction, no nipple inversion, no nipple discharge, no skin discoloration, no axillary or supraclavicular lymphadenopathy Abdomen: no palpable masses or tenderness, no rebound or guarding Extremities: no edema or skin discoloration or tenderness  Pelvic: Vulva: Normal             Vagina: No gross lesions or discharge  Cervix: No gross lesions or discharge.  Pap reflex done.  [facility] Uterus  AV, normal size, shape and consistency, non-tender and mobile  Adnexa  Without masses or tenderness  Anus: Normal   Assessment/Plan:  [age].o. female for annual exam  1. Encounter for routine gynecological examination with Papanicolaou smear of cervix Well on Mirena IUD x 02/2016.  No BTB.  No pelvic pain.  Normal vaginal secretions.  No pain with IC.  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  HPV 16-18-45 neg.  Pap neg 02/2020.  Will repeat Pap today. Urine/ BMs wnl. Breasts wnl. Mammo 12/2020, Dx mammo/US 02/05/2021 Benign Cyst.  BMI 22.44.  Fit and healthy nutrition.  Health labs with Fam MD.  ColoGard Neg 2022. - Cytology - PAP( [place])  2. Dysplasia of cervix, low grade (CIN 1) - Cytology - PAP( [place])  3. Encounter for routine checking of intrauterine contraceptive device (IUD)  Mirena  IUD x 02/2016.  Well tolerated and in good position.  Will keep this IUD for 7 years.  Princess Bruins MD, 9:55 AM 03/21/2021

## 2021-03-25 LAB — CYTOLOGY - PAP
Comment: NEGATIVE
Diagnosis: UNDETERMINED — AB
High risk HPV: NEGATIVE

## 2021-08-28 ENCOUNTER — Emergency Department (HOSPITAL_BASED_OUTPATIENT_CLINIC_OR_DEPARTMENT_OTHER): Payer: 59

## 2021-08-28 ENCOUNTER — Observation Stay (HOSPITAL_BASED_OUTPATIENT_CLINIC_OR_DEPARTMENT_OTHER): Payer: 59 | Admitting: Anesthesiology

## 2021-08-28 ENCOUNTER — Encounter (HOSPITAL_COMMUNITY)
Admission: EM | Disposition: A | Discharge status: Home / Self Care | Payer: Self-pay | Source: Home / Self Care | Attending: Vascular Surgery

## 2021-08-28 ENCOUNTER — Observation Stay (HOSPITAL_COMMUNITY): Payer: 59

## 2021-08-28 ENCOUNTER — Other Ambulatory Visit: Payer: Self-pay

## 2021-08-28 ENCOUNTER — Encounter (HOSPITAL_BASED_OUTPATIENT_CLINIC_OR_DEPARTMENT_OTHER): Payer: Self-pay | Admitting: Emergency Medicine

## 2021-08-28 ENCOUNTER — Observation Stay (HOSPITAL_COMMUNITY): Payer: 59 | Admitting: Anesthesiology

## 2021-08-28 ENCOUNTER — Inpatient Hospital Stay (HOSPITAL_BASED_OUTPATIENT_CLINIC_OR_DEPARTMENT_OTHER)
Admission: EM | Admit: 2021-08-28 | Discharge: 2021-08-29 | DRG: 074 | Disposition: A | Discharge status: Home / Self Care | Payer: 59 | Attending: Vascular Surgery | Admitting: Vascular Surgery

## 2021-08-28 DIAGNOSIS — I871 Compression of vein: Secondary | ICD-10-CM | POA: Diagnosis present

## 2021-08-28 DIAGNOSIS — G540 Brachial plexus disorders: Secondary | ICD-10-CM

## 2021-08-28 DIAGNOSIS — I82B12 Acute embolism and thrombosis of left subclavian vein: Secondary | ICD-10-CM | POA: Diagnosis not present

## 2021-08-28 DIAGNOSIS — Z8042 Family history of malignant neoplasm of prostate: Secondary | ICD-10-CM

## 2021-08-28 DIAGNOSIS — I82A12 Acute embolism and thrombosis of left axillary vein: Secondary | ICD-10-CM | POA: Diagnosis not present

## 2021-08-28 DIAGNOSIS — Z821 Family history of blindness and visual loss: Secondary | ICD-10-CM

## 2021-08-28 DIAGNOSIS — R001 Bradycardia, unspecified: Secondary | ICD-10-CM | POA: Diagnosis not present

## 2021-08-28 DIAGNOSIS — Z975 Presence of (intrauterine) contraceptive device: Secondary | ICD-10-CM

## 2021-08-28 DIAGNOSIS — I82612 Acute embolism and thrombosis of superficial veins of left upper extremity: Secondary | ICD-10-CM

## 2021-08-28 DIAGNOSIS — Z79899 Other long term (current) drug therapy: Secondary | ICD-10-CM

## 2021-08-28 DIAGNOSIS — Z803 Family history of malignant neoplasm of breast: Secondary | ICD-10-CM

## 2021-08-28 DIAGNOSIS — Z8249 Family history of ischemic heart disease and other diseases of the circulatory system: Secondary | ICD-10-CM

## 2021-08-28 DIAGNOSIS — Z818 Family history of other mental and behavioral disorders: Secondary | ICD-10-CM

## 2021-08-28 DIAGNOSIS — I82622 Acute embolism and thrombosis of deep veins of left upper extremity: Secondary | ICD-10-CM

## 2021-08-28 DIAGNOSIS — Z820 Family history of epilepsy and other diseases of the nervous system: Secondary | ICD-10-CM

## 2021-08-28 DIAGNOSIS — Z825 Family history of asthma and other chronic lower respiratory diseases: Secondary | ICD-10-CM

## 2021-08-28 DIAGNOSIS — R69 Illness, unspecified: Secondary | ICD-10-CM | POA: Diagnosis not present

## 2021-08-28 DIAGNOSIS — M7989 Other specified soft tissue disorders: Secondary | ICD-10-CM | POA: Diagnosis not present

## 2021-08-28 HISTORY — DX: Other seasonal allergic rhinitis: J30.2

## 2021-08-28 HISTORY — PX: AV FISTULA PLACEMENT: SHX1204

## 2021-08-28 HISTORY — DX: Anxiety disorder, unspecified: F41.9

## 2021-08-28 LAB — CBC WITH DIFFERENTIAL/PLATELET
Abs Immature Granulocytes: 0.01 10*3/uL (ref 0.00–0.07)
Basophils Absolute: 0 10*3/uL (ref 0.0–0.1)
Basophils Relative: 0 %
Eosinophils Absolute: 0.1 10*3/uL (ref 0.0–0.5)
Eosinophils Relative: 2 %
HCT: 36 % (ref 36.0–46.0)
Hemoglobin: 11.1 g/dL — ABNORMAL LOW (ref 12.0–15.0)
Immature Granulocytes: 0 %
Lymphocytes Relative: 20 %
Lymphs Abs: 1 10*3/uL (ref 0.7–4.0)
MCH: 27.5 pg (ref 26.0–34.0)
MCHC: 30.8 g/dL (ref 30.0–36.0)
MCV: 89.3 fL (ref 80.0–100.0)
Monocytes Absolute: 0.4 10*3/uL (ref 0.1–1.0)
Monocytes Relative: 7 %
Neutro Abs: 3.7 10*3/uL (ref 1.7–7.7)
Neutrophils Relative %: 71 %
Platelets: 214 10*3/uL (ref 150–400)
RBC: 4.03 MIL/uL (ref 3.87–5.11)
RDW: 13.5 % (ref 11.5–15.5)
WBC: 5.2 10*3/uL (ref 4.0–10.5)
nRBC: 0 % (ref 0.0–0.2)

## 2021-08-28 LAB — COMPREHENSIVE METABOLIC PANEL
ALT: 30 U/L (ref 0–44)
ALT: 33 U/L (ref 0–44)
AST: 33 U/L (ref 15–41)
AST: 34 U/L (ref 15–41)
Albumin: 4.2 g/dL (ref 3.5–5.0)
Albumin: 4 g/dL (ref 3.5–5.0)
Alkaline Phosphatase: 72 U/L (ref 38–126)
Alkaline Phosphatase: 71 U/L (ref 38–126)
Anion gap: 9 (ref 5–15)
Anion gap: 10 (ref 5–15)
BUN: 12 mg/dL (ref 6–20)
BUN: 8 mg/dL (ref 6–20)
CO2: 25 mmol/L (ref 22–32)
CO2: 21 mmol/L — ABNORMAL LOW (ref 22–32)
Calcium: 9.3 mg/dL (ref 8.9–10.3)
Calcium: 8.9 mg/dL (ref 8.9–10.3)
Chloride: 105 mmol/L (ref 98–111)
Chloride: 110 mmol/L (ref 98–111)
Creatinine, Ser: 0.94 mg/dL (ref 0.44–1.00)
Creatinine, Ser: 0.86 mg/dL (ref 0.44–1.00)
GFR, Estimated: >60 mL/min (ref >60)
GFR, Estimated: >60 mL/min (ref >60)
Glucose, Bld: 85 mg/dL (ref 70–99)
Glucose, Bld: 83 mg/dL (ref 70–99)
Potassium: 3.7 mmol/L (ref 3.5–5.1)
Potassium: 3.7 mmol/L (ref 3.5–5.1)
Sodium: 139 mmol/L (ref 135–145)
Sodium: 141 mmol/L (ref 135–145)
Total Bilirubin: 0.4 mg/dL (ref 0.3–1.2)
Total Bilirubin: 0.8 mg/dL (ref 0.3–1.2)
Total Protein: 6.7 g/dL (ref 6.5–8.1)
Total Protein: 6.3 g/dL — ABNORMAL LOW (ref 6.5–8.1)

## 2021-08-28 LAB — CK: Total CK: 162 U/L (ref 38–234)

## 2021-08-28 LAB — URINALYSIS, ROUTINE W REFLEX MICROSCOPIC
Bilirubin Urine: NEGATIVE
Glucose, UA: NEGATIVE mg/dL
Hgb urine dipstick: NEGATIVE
Ketones, ur: 80 mg/dL — AB
Leukocytes,Ua: NEGATIVE
Nitrite: NEGATIVE
Protein, ur: NEGATIVE mg/dL
Specific Gravity, Urine: >1.046 — ABNORMAL HIGH (ref 1.005–1.030)
pH: 5 (ref 5.0–8.0)

## 2021-08-28 LAB — CBC
HCT: 36.4 % (ref 36.0–46.0)
HCT: 35.2 % — ABNORMAL LOW (ref 36.0–46.0)
Hemoglobin: 11.8 g/dL — ABNORMAL LOW (ref 12.0–15.0)
Hemoglobin: 11.3 g/dL — ABNORMAL LOW (ref 12.0–15.0)
MCH: 28.5 pg (ref 26.0–34.0)
MCH: 28.8 pg (ref 26.0–34.0)
MCHC: 32.4 g/dL (ref 30.0–36.0)
MCHC: 32.1 g/dL (ref 30.0–36.0)
MCV: 87.9 fL (ref 80.0–100.0)
MCV: 89.8 fL (ref 80.0–100.0)
Platelets: 234 10*3/uL (ref 150–400)
Platelets: 212 10*3/uL (ref 150–400)
RBC: 4.14 MIL/uL (ref 3.87–5.11)
RBC: 3.92 MIL/uL (ref 3.87–5.11)
RDW: 13.3 % (ref 11.5–15.5)
RDW: 13.3 % (ref 11.5–15.5)
WBC: 5.9 10*3/uL (ref 4.0–10.5)
WBC: 6.8 10*3/uL (ref 4.0–10.5)
nRBC: 0 % (ref 0.0–0.2)
nRBC: 0 % (ref 0.0–0.2)

## 2021-08-28 LAB — PROTIME-INR
INR: 1.1 (ref 0.8–1.2)
Prothrombin Time: 14.2 seconds (ref 11.4–15.2)

## 2021-08-28 LAB — HIV ANTIBODY (ROUTINE TESTING W REFLEX): HIV Screen 4th Generation wRfx: NONREACTIVE

## 2021-08-28 LAB — TROPONIN I (HIGH SENSITIVITY)
Troponin I (High Sensitivity): <2 ng/L (ref <18)
Troponin I (High Sensitivity): 4 ng/L (ref <18)

## 2021-08-28 LAB — FIBRINOGEN: Fibrinogen: 258 mg/dL (ref 210–475)

## 2021-08-28 LAB — PREGNANCY, URINE: Preg Test, Ur: NEGATIVE

## 2021-08-28 LAB — MRSA NEXT GEN BY PCR, NASAL: MRSA by PCR Next Gen: NOT DETECTED

## 2021-08-28 LAB — HEPARIN LEVEL (UNFRACTIONATED): Heparin Unfractionated: 0.16 IU/mL — ABNORMAL LOW (ref 0.30–0.70)

## 2021-08-28 SURGERY — ARTERIOVENOUS (AV) FISTULA CREATION
Anesthesia: Monitor Anesthesia Care | Laterality: Left

## 2021-08-28 MED ORDER — GUAIFENESIN-DM 100-10 MG/5ML PO SYRP: 15.0000 mL | ORAL_SOLUTION | ORAL | Status: DC | PRN

## 2021-08-28 MED ORDER — PROPOFOL 10 MG/ML IV BOLUS
INTRAVENOUS | Status: DC | PRN
  Administered 2021-08-28: 30 mg via INTRAVENOUS
  Administered 2021-08-28: 40 mg via INTRAVENOUS

## 2021-08-28 MED ORDER — SODIUM CHLORIDE 0.9% FLUSH
3.0000 mL | Freq: Two times a day (BID) | INTRAVENOUS | Status: DC
  Administered 2021-08-29: 3 mL via INTRAVENOUS

## 2021-08-28 MED ORDER — FENTANYL CITRATE (PF) 250 MCG/5ML IJ SOLN
INTRAMUSCULAR | Status: AC
  Filled 2021-08-28: qty 5

## 2021-08-28 MED ORDER — HEPARIN 6000 UNIT IRRIGATION SOLUTION
Status: DC | PRN
  Administered 2021-08-28: 1

## 2021-08-28 MED ORDER — LIDOCAINE 2% (20 MG/ML) 5 ML SYRINGE
INTRAMUSCULAR | Status: AC
  Filled 2021-08-28: qty 10

## 2021-08-28 MED ORDER — IOHEXOL 350 MG/ML SOLN
100.0000 mL | Freq: Once | INTRAVENOUS | Status: AC | PRN
  Administered 2021-08-28: 75 mL via INTRAVENOUS

## 2021-08-28 MED ORDER — CEFAZOLIN SODIUM-DEXTROSE 2-3 GM-%(50ML) IV SOLR
INTRAVENOUS | Status: DC | PRN
  Administered 2021-08-28: 2 g via INTRAVENOUS

## 2021-08-28 MED ORDER — MIDAZOLAM HCL 2 MG/2ML IJ SOLN
INTRAMUSCULAR | Status: DC | PRN
  Administered 2021-08-28: 2 mg via INTRAVENOUS

## 2021-08-28 MED ORDER — PHENOL 1.4 % MT LIQD
1.0000 | OROMUCOSAL | Status: DC | PRN
  Filled 2021-08-28: qty 177

## 2021-08-28 MED ORDER — ONDANSETRON HCL 4 MG/2ML IJ SOLN
INTRAMUSCULAR | Status: DC | PRN
  Administered 2021-08-28: 4 mg via INTRAVENOUS

## 2021-08-28 MED ORDER — HYDRALAZINE HCL 20 MG/ML IJ SOLN: 5.0000 mg | INTRAMUSCULAR | Status: DC | PRN

## 2021-08-28 MED ORDER — HEPARIN (PORCINE) 25000 UT/250ML-% IV SOLN
1000.0000 [IU]/h | INTRAVENOUS | Status: DC
  Administered 2021-08-28: 800 [IU]/h via INTRAVENOUS
  Administered 2021-08-28: 1000 [IU]/h via INTRAVENOUS
  Filled 2021-08-28 (×2): qty 250

## 2021-08-28 MED ORDER — ONDANSETRON HCL 4 MG/2ML IJ SOLN: 4.0000 mg | Freq: Four times a day (QID) | INTRAMUSCULAR | Status: DC | PRN

## 2021-08-28 MED ORDER — LACTATED RINGERS IV SOLN
INTRAVENOUS | Status: DC
Start: 2021-08-28 — End: 2021-08-28

## 2021-08-28 MED ORDER — PANTOPRAZOLE SODIUM 40 MG PO TBEC
40.0000 mg | DELAYED_RELEASE_TABLET | Freq: Every day | ORAL | Status: DC
Start: 2021-08-28 — End: 2021-08-29
  Administered 2021-08-29: 40 mg via ORAL
  Filled 2021-08-28: qty 1

## 2021-08-28 MED ORDER — LIDOCAINE HCL (PF) 1 % IJ SOLN
INTRAMUSCULAR | Status: DC | PRN
  Administered 2021-08-28: 3 mL

## 2021-08-28 MED ORDER — PHENYLEPHRINE 80 MCG/ML (10ML) SYRINGE FOR IV PUSH (FOR BLOOD PRESSURE SUPPORT)
PREFILLED_SYRINGE | INTRAVENOUS | Status: DC | PRN
  Administered 2021-08-28 (×6): 80 ug via INTRAVENOUS

## 2021-08-28 MED ORDER — SODIUM CHLORIDE 0.9% FLUSH: 3.0000 mL | INTRAVENOUS | Status: DC | PRN

## 2021-08-28 MED ORDER — HEPARIN (PORCINE) 25000 UT/250ML-% IV SOLN: 1100.0000 [IU]/h | INTRAVENOUS | Status: DC

## 2021-08-28 MED ORDER — ALTEPLASE 1 MG/ML SYRINGE FOR VASCULAR PROCEDURE
4.0000 mg | Freq: Once | INTRAMUSCULAR | Status: AC
  Administered 2021-08-28: 4 mg via INTRA_ARTERIAL
  Filled 2021-08-28: qty 4

## 2021-08-28 MED ORDER — CHLORHEXIDINE GLUCONATE 0.12 % MT SOLN: 15.0000 mL | Freq: Once | OROMUCOSAL | Status: AC

## 2021-08-28 MED ORDER — EPHEDRINE SULFATE-NACL 50-0.9 MG/10ML-% IV SOSY
PREFILLED_SYRINGE | INTRAVENOUS | Status: DC | PRN
  Administered 2021-08-28 (×2): 5 mg via INTRAVENOUS

## 2021-08-28 MED ORDER — FENTANYL CITRATE (PF) 250 MCG/5ML IJ SOLN
INTRAMUSCULAR | Status: DC | PRN
  Administered 2021-08-28: 50 ug via INTRAVENOUS

## 2021-08-28 MED ORDER — LABETALOL HCL 5 MG/ML IV SOLN: 10.0000 mg | INTRAVENOUS | Status: DC | PRN

## 2021-08-28 MED ORDER — MORPHINE SULFATE (PF) 4 MG/ML IV SOLN
5.0000 mg | INTRAVENOUS | Status: DC | PRN
  Administered 2021-08-28: 5 mg via INTRAVENOUS
  Filled 2021-08-28 (×2): qty 2

## 2021-08-28 MED ORDER — ONDANSETRON HCL 4 MG/2ML IJ SOLN
4.0000 mg | Freq: Four times a day (QID) | INTRAMUSCULAR | Status: DC | PRN
Start: 2021-08-28 — End: 2021-08-28

## 2021-08-28 MED ORDER — LIDOCAINE 2% (20 MG/ML) 5 ML SYRINGE
INTRAMUSCULAR | Status: DC | PRN
  Administered 2021-08-28: 40 mg via INTRAVENOUS

## 2021-08-28 MED ORDER — ACETAMINOPHEN 500 MG PO TABS
1000.0000 mg | ORAL_TABLET | Freq: Once | ORAL | Status: AC
  Administered 2021-08-28: 1000 mg via ORAL
  Filled 2021-08-28: qty 2

## 2021-08-28 MED ORDER — PROMETHAZINE HCL 25 MG/ML IJ SOLN: 6.2500 mg | INTRAMUSCULAR | Status: DC | PRN

## 2021-08-28 MED ORDER — SODIUM CHLORIDE 0.9 % IV SOLN
250.0000 mL | INTRAVENOUS | Status: DC | PRN
  Administered 2021-08-29: 250 mL via INTRAVENOUS

## 2021-08-28 MED ORDER — HYDROMORPHONE HCL 1 MG/ML IJ SOLN
INTRAMUSCULAR | Status: AC
  Filled 2021-08-28: qty 1

## 2021-08-28 MED ORDER — CEFAZOLIN SODIUM 1 G IJ SOLR
INTRAMUSCULAR | Status: AC
  Filled 2021-08-28: qty 20

## 2021-08-28 MED ORDER — SODIUM CHLORIDE 0.9 % IV SOLN
1.0000 mg/h | INTRAVENOUS | Status: DC
  Filled 2021-08-28 (×4): qty 10

## 2021-08-28 MED ORDER — ORAL CARE MOUTH RINSE: 15.0000 mL | Freq: Once | OROMUCOSAL | Status: AC

## 2021-08-28 MED ORDER — MEPERIDINE HCL 25 MG/ML IJ SOLN: 6.2500 mg | INTRAMUSCULAR | Status: DC | PRN

## 2021-08-28 MED ORDER — MIDAZOLAM HCL 2 MG/2ML IJ SOLN: 1.0000 mg | INTRAMUSCULAR | Status: DC | PRN

## 2021-08-28 MED ORDER — OXYCODONE HCL 5 MG/5ML PO SOLN: 5.0000 mg | Freq: Once | ORAL | Status: DC | PRN

## 2021-08-28 MED ORDER — POTASSIUM CHLORIDE CRYS ER 20 MEQ PO TBCR
20.0000 meq | EXTENDED_RELEASE_TABLET | Freq: Once | ORAL | Status: AC
  Administered 2021-08-28: 20 meq via ORAL
  Filled 2021-08-28: qty 1

## 2021-08-28 MED ORDER — HEPARIN BOLUS VIA INFUSION
3800.0000 [IU] | Freq: Once | INTRAVENOUS | Status: AC
  Administered 2021-08-28: 3800 [IU] via INTRAVENOUS

## 2021-08-28 MED ORDER — OXYCODONE HCL 5 MG PO TABS: 5.0000 mg | ORAL_TABLET | Freq: Once | ORAL | Status: DC | PRN

## 2021-08-28 MED ORDER — HEPARIN 6000 UNIT IRRIGATION SOLUTION
Status: AC
  Filled 2021-08-28: qty 500

## 2021-08-28 MED ORDER — 0.9 % SODIUM CHLORIDE (POUR BTL) OPTIME
TOPICAL | Status: DC | PRN
  Administered 2021-08-28: 1000 mL

## 2021-08-28 MED ORDER — MIDAZOLAM HCL 2 MG/2ML IJ SOLN
INTRAMUSCULAR | Status: AC
  Filled 2021-08-28: qty 2

## 2021-08-28 MED ORDER — SCOPOLAMINE 1 MG/3DAYS TD PT72
1.0000 | MEDICATED_PATCH | Freq: Once | TRANSDERMAL | Status: DC
  Administered 2021-08-28: 1.5 mg via TRANSDERMAL
  Filled 2021-08-28: qty 1

## 2021-08-28 MED ORDER — ALUM & MAG HYDROXIDE-SIMETH 200-200-20 MG/5ML PO SUSP: 15.0000 mL | ORAL | Status: DC | PRN

## 2021-08-28 MED ORDER — SODIUM CHLORIDE 0.9 % IV SOLN
1.0000 mg/h | INTRAVENOUS | Status: DC
  Administered 2021-08-29: 1 mg/h
  Filled 2021-08-28 (×5): qty 10

## 2021-08-28 MED ORDER — HYDROMORPHONE HCL 1 MG/ML IJ SOLN
0.2500 mg | INTRAMUSCULAR | Status: DC | PRN
  Administered 2021-08-28: 0.25 mg via INTRAVENOUS

## 2021-08-28 MED ORDER — IODIXANOL 320 MG/ML IV SOLN
INTRAVENOUS | Status: DC | PRN
  Administered 2021-08-28: 10 mL

## 2021-08-28 MED ORDER — CHLORHEXIDINE GLUCONATE 0.12 % MT SOLN
OROMUCOSAL | Status: AC
  Administered 2021-08-28: 15 mL via OROMUCOSAL
  Filled 2021-08-28: qty 15

## 2021-08-28 MED ORDER — PROPOFOL 500 MG/50ML IV EMUL
INTRAVENOUS | Status: DC | PRN
  Administered 2021-08-28: 100 ug/kg/min via INTRAVENOUS

## 2021-08-28 MED ORDER — METOPROLOL TARTRATE 5 MG/5ML IV SOLN: 2.0000 mg | INTRAVENOUS | Status: DC | PRN

## 2021-08-28 MED ORDER — MIDAZOLAM HCL 2 MG/2ML IJ SOLN: 0.5000 mg | Freq: Once | INTRAMUSCULAR | Status: DC | PRN

## 2021-08-28 MED ORDER — EPHEDRINE 5 MG/ML INJ
INTRAVENOUS | Status: AC
  Filled 2021-08-28: qty 5

## 2021-08-28 SURGICAL SUPPLY — 49 items
APL PRP STRL LF DISP 70% ISPRP (MISCELLANEOUS) ×1
APL SKNCLS STERI-STRIP NONHPOA (GAUZE/BANDAGES/DRESSINGS) ×1
ARMBAND PINK RESTRICT EXTREMIT (MISCELLANEOUS) ×2 IMPLANT
BENZOIN TINCTURE PRP APPL 2/3 (GAUZE/BANDAGES/DRESSINGS) ×2 IMPLANT
CANISTER SUCT 3000ML PPV (MISCELLANEOUS) ×2 IMPLANT
CANNULA VESSEL 3MM 2 BLNT TIP (CANNULA) ×2 IMPLANT
CATH ANGIO BERNSTEIN 5X65X.035 (CATHETERS) ×1 IMPLANT
CATH INFUS 135CMX30CM (CATHETERS) IMPLANT
CATH INFUS UNIFUSE 90X50 5FR (CATHETERS) ×1 IMPLANT
CATH PULSE SPRAY 45CMX15CM 5FR (CATHETERS) ×1 IMPLANT
CATH VISIONS PV .035 IVUS (CATHETERS) ×1 IMPLANT
CHLORAPREP W/TINT 26 (MISCELLANEOUS) ×2 IMPLANT
CLIP LIGATING EXTRA MED SLVR (CLIP) ×2 IMPLANT
CLIP LIGATING EXTRA SM BLUE (MISCELLANEOUS) ×2 IMPLANT
COVER PROBE W GEL 5X96 (DRAPES) ×1 IMPLANT
DEVICE TORQUE H2O (MISCELLANEOUS) ×1 IMPLANT
DRAPE BRACHIAL (DRAPES) ×1 IMPLANT
ELECT REM PT RETURN 9FT ADLT (ELECTROSURGICAL) ×2
ELECTRODE REM PT RTRN 9FT ADLT (ELECTROSURGICAL) ×1 IMPLANT
GLOVE SURG SS PI 8.0 STRL IVOR (GLOVE) ×2 IMPLANT
GOWN STRL REUS W/ TWL LRG LVL3 (GOWN DISPOSABLE) ×2 IMPLANT
GOWN STRL REUS W/ TWL XL LVL3 (GOWN DISPOSABLE) ×1 IMPLANT
GOWN STRL REUS W/TWL LRG LVL3 (GOWN DISPOSABLE) ×4
GOWN STRL REUS W/TWL XL LVL3 (GOWN DISPOSABLE) ×2
GUIDEWIRE ANGLED .035X150CM (WIRE) ×1 IMPLANT
GUIDEWIRE ANGLED .035X260CM (WIRE) ×1 IMPLANT
INSERT FOGARTY SM (MISCELLANEOUS) IMPLANT
KIT BASIN OR (CUSTOM PROCEDURE TRAY) ×2 IMPLANT
KIT MICROPUNCTURE NIT STIFF (SHEATH) ×1 IMPLANT
KIT TURNOVER KIT B (KITS) ×2 IMPLANT
NDL 18GX1X1/2 (RX/OR ONLY) (NEEDLE) IMPLANT
NEEDLE 18GX1X1/2 (RX/OR ONLY) (NEEDLE) IMPLANT
NS IRRIG 1000ML POUR BTL (IV SOLUTION) ×2 IMPLANT
PACK CV ACCESS (CUSTOM PROCEDURE TRAY) ×2 IMPLANT
PAD ARMBOARD 7.5X6 YLW CONV (MISCELLANEOUS) ×4 IMPLANT
SHEATH PINNACLE 8F 10CM (SHEATH) ×1 IMPLANT
SLEEVE ISOL F/PACE RF HD COVER (MISCELLANEOUS) ×1 IMPLANT
SLING ARM FOAM STRAP LRG (SOFTGOODS) IMPLANT
SLING ARM FOAM STRAP MED (SOFTGOODS) IMPLANT
STRIP CLOSURE SKIN 1/2X4 (GAUZE/BANDAGES/DRESSINGS) ×2 IMPLANT
SUT MNCRL AB 4-0 PS2 18 (SUTURE) ×2 IMPLANT
SUT PROLENE 6 0 BV (SUTURE) ×2 IMPLANT
SUT VIC AB 3-0 SH 27 (SUTURE) ×2
SUT VIC AB 3-0 SH 27X BRD (SUTURE) ×1 IMPLANT
SYR 3ML LL SCALE MARK (SYRINGE) IMPLANT
TOWEL GREEN STERILE (TOWEL DISPOSABLE) ×2 IMPLANT
UNDERPAD 30X36 HEAVY ABSORB (UNDERPADS AND DIAPERS) ×2 IMPLANT
WATER STERILE IRR 1000ML POUR (IV SOLUTION) ×2 IMPLANT
WIRE BENTSON .035X145CM (WIRE) ×1 IMPLANT

## 2021-08-28 NOTE — ED Notes (Signed)
Called [facility] for transport Ed to Ed spoke with Ander Purpura and she is sending truck now

## 2021-08-28 NOTE — Anesthesia Postprocedure Evaluation (Signed)
Anesthesia Post Note  Patient: Wendy Lin  Procedure(s) Performed: LEFT ARM VENOGRAM INITIATION OF THROMBOLYSIS (Left)     Patient location during evaluation: PACU Anesthesia Type: MAC Level of consciousness: awake and alert Pain management: pain level controlled Vital Signs Assessment: post-procedure vital signs reviewed and stable Respiratory status: spontaneous breathing, nonlabored ventilation and respiratory function stable Cardiovascular status: stable and blood pressure returned to baseline Postop Assessment: no apparent nausea or vomiting Anesthetic complications: no   No notable events documented.  Last Vitals:  Vitals:   08/28/21 2015 08/28/21 2030  BP: 109/68 108/69  Pulse: (!) 56 (!) 55  Resp: 13 14  Temp:  36.8 C  SpO2: 100% 98%    Last Pain:  Vitals:   08/28/21 2015  TempSrc:   PainSc: 6                  Yaslene Lindamood,W. EDMOND

## 2021-08-28 NOTE — Anesthesia Procedure Notes (Signed)
Procedure Name: MAC Date/Time: 08/28/2021 6:44 PM  Performed by: Reece Agar, CRNAPre-anesthesia Checklist: Patient identified, Emergency Drugs available, Suction available and Patient being monitored Patient Re-evaluated:Patient Re-evaluated prior to induction Oxygen Delivery Method: Simple face mask

## 2021-08-28 NOTE — Anesthesia Preprocedure Evaluation (Addendum)
Anesthesia Evaluation  Patient identified by MRN, date of birth, ID band Patient awake    Reviewed: Allergy & Precautions, H&P , NPO status , Patient's Chart, lab work & pertinent test results  History of Anesthesia Complications Negative for: history of anesthetic complications  Airway Mallampati: I  TM Distance: >3 FB Neck ROM: Full    Dental no notable dental hx. 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, with thrombus in left basilic and brachial veins)   Rhythm:Regular Rate:Normal     Neuro/Psych Anxiety negative neurological ROS     GI/Hepatic negative GI ROS, Neg liver ROS,   Endo/Other  negative endocrine ROS  Renal/GU negative Renal ROS  negative genitourinary   Musculoskeletal   Abdominal   Peds  Hematology negative hematology ROS (+)   Anesthesia Other Findings   Reproductive/Obstetrics negative OB ROS                            Anesthesia Physical Anesthesia Plan  ASA: 2 and emergent  Anesthesia Plan: MAC   Post-op Pain Management: Tylenol PO (pre-op)*   Induction: Intravenous  PONV Risk Score and Plan: 2 and Ondansetron, Treatment may vary due to age or medical condition, Propofol infusion and Midazolam  Airway Management Planned: Natural Airway and Simple Face Mask  Additional Equipment: None  Intra-op Plan:   Post-operative Plan:   Informed Consent: I have reviewed the patients History and Physical, chart, labs and discussed the procedure including the risks, benefits and alternatives for the proposed anesthesia with the patient or authorized representative who has indicated his/her understanding and acceptance.     Dental advisory  given  Plan Discussed with: CRNA  Anesthesia Plan Comments:        Anesthesia Quick Evaluation

## 2021-08-28 NOTE — Progress Notes (Signed)
ANTICOAGULATION CONSULT NOTE - Initial Consult  Pharmacy Consult for heparin Indication:  subclavian vein thrombus  No Known Allergies  Patient Measurements: Height: 5' 6"  (167.6 cm) Weight: 63 kg (139 lb) IBW/kg (Calculated) : 59.3 Heparin Dosing Weight: 63kg  Vital Signs: Temp: 98.2 F (36.8 C) (06/22 1100) Temp Source: Oral (06/22 1100) BP: 161/80 (06/22 1346) Pulse Rate: 57 (06/22 1346)  Labs: Recent Labs    08/28/21 1154  HGB 11.1*  HCT 36.0  PLT 214  CREATININE 0.94  CKTOTAL 162  TROPONINIHS <2    Estimated Creatinine Clearance: 69.3 mL/min (by C-G formula based on SCr of 0.94 mg/dL).   Medical History: Past Medical History:  Diagnosis Date   Abnormal Pap smear    ASCUS, low grade squamous intraepithelial lesion   AMA (advanced maternal age) multigravida 35+    Blood transfusion complicating pregnancy 6067   s/p uterine atony   Low-lying placenta    Postpartum care following vaginal delivery (9/12) 11/20/2010    Medications:  Infusions:   heparin      Assessment: 81 yof presented to the ED with arm pain. 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 Baseline Hgb is low at 11.1 but platelets are WNL. She is not on anticoagulation PTA.  Goal of Therapy:  Heparin level 0.3-0.7 units/ml Monitor platelets by anticoagulation protocol: Yes   Plan:  Heparin bolus 3800 units IV x 1 Heparin gtt 1000 units/hr Check a 6 hr heparin level Daily heparin level and CBC  Kailen Hinkle, Rande Lawman 08/28/2021,2:20 PM

## 2021-08-28 NOTE — H&P (Signed)
[facility] [place]  ASSESSMENT / PLAN: 46 y.o. female with left upper extremity deep venous thrombosis. 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 I explained the typical course of therapy including initiating catheter directed thrombolysis for symptom control, followed by anticoagulation, followed by first rib resection.  We reviewed the risks of thrombolysis including bleeding.  She has no absolute or relative contraindications to thrombolysis.  Plan to proceed the OR tonight.  CHIEF COMPLAINT: Painful, swollen left arm  HISTORY OF PRESENT ILLNESS: Wendy CODERRE is a 46 y.o. female who presents for evaluation of left arm pain and swelling over the past week.  The patient is physically fit and has been exercising with more of a focus on weight training in the past several months.  She noticed pain and swelling develop 4 to 5 days ago.  She initially ascribed this to muscle soreness, but this became progressively worse.  She was evaluated today in the draw bridge ER where duplex ultrasound showed acute deep venous thrombosis of the left subclavian and axillary veins.  The patient reports no personal or family history of thrombophilia.  She denies any recent trauma.  She denies pregnancy.  No recent surgeries.  Past Medical History:  Diagnosis Date   Abnormal Pap smear    ASCUS, low grade squamous intraepithelial lesion   AMA (advanced maternal age) multigravida 35+    Blood transfusion complicating pregnancy 7106   s/p uterine atony   Low-lying placenta    Postpartum care following vaginal delivery (9/12) 11/20/2010    Past Surgical History:  Procedure Laterality Date   COLPOSCOPY  2017   INTRAUTERINE DEVICE (IUD) INSERTION     mirena inserted 12/17   KNEE SURGERY  1995   MOUTH SURGERY  1994   WISDOM TOOTH EXTRACTION      Family History  Problem Relation Age of Onset   Mitral valve prolapse Mother    Seizures Mother        in pregnancy    Heart disease Mother    Cancer Mother        breast   Parkinson's disease Mother    Dementia Mother    Vision loss Father        glacoma   Mitral valve prolapse Sister    Depression Sister    Migraines Sister    Mental illness Sister    Birth defects Maternal Aunt        Breast   Mitral valve prolapse Maternal Uncle    Heart disease Maternal Uncle    Heart disease Maternal Grandmother    COPD Maternal Grandfather    Vision loss Maternal Grandfather        glacoma   Cancer Paternal Grandfather        prostate ca    Social History   Socioeconomic History   Marital status: Single    Spouse name: Not on file   Number of children: Not on file   Years of education: Not on file   Highest education level: Not on file  Occupational History   Not on file  Tobacco Use   Smoking status: Never   Smokeless tobacco: Never  Vaping Use   Vaping Use: Never used  Substance and Sexual Activity   Alcohol use: Yes    Comment: SOCIAL   Drug use: No   Sexual activity: Yes    Partners: Male    Birth control/protection: I.U.D.  Other Topics Concern   Not on  file  Social History Narrative   Not on file   Social Determinants of Health   Financial Resource Strain: Not on file  Food Insecurity: Not on file  Transportation Needs: Not on file  Physical Activity: Not on file  Stress: Not on file  Social Connections: Not on file  Intimate Partner Violence: Not on file    No Known Allergies  Current Facility-Administered Medications  Medication Dose Route Frequency Provider Last Rate Last Admin   heparin ADULT infusion 100 units/mL (25000 units/226m)  1,000 Units/hr Intravenous Continuous Rumbarger, RValeda Malm RPH 10 mL/hr at 08/28/21 1451 1,000 Units/hr at 08/28/21 1451   Current Outpatient Medications  Medication Sig Dispense Refill   ibuprofen (ADVIL) 200 MG tablet Take 200 mg by mouth every 6 (six) hours as needed.     levonorgestrel (MIRENA) 20 MCG/24HR IUD by Intrauterine  route.     MAGNESIUM PO Take 1 tablet by mouth daily.     Multiple Vitamin (MULTIVITAMIN PO) Take 1 tablet by mouth daily.     sertraline (ZOLOFT) 25 MG tablet Take 25 mg by mouth daily.     triamcinolone (NASACORT) 55 MCG/ACT AERO nasal inhaler Place 2 sprays into the nose daily.      PHYSICAL EXAM Vitals:   08/28/21 1346 08/28/21 1400 08/28/21 1455 08/28/21 1648  BP: (!) 161/80 126/78 (!) 165/103 133/86  Pulse: (!) 57 (!) 53 79 66  Resp: 16 14 15 14   Temp:   98 F (36.7 C)   TempSrc:   Oral   SpO2: 100% 100% 99% 100%  Weight:      Height:        Constitutional: Healthy appearing woman in no acute distress Cardiac: regular rate and rhythm.  Respiratory: unlabored. Abdominal:  soft, non-tender, non-distended.  Peripheral vascular: Prominent venous collaterals about the left chest, shoulder, and upper arm.  Significant edema about the left shoulder and upper arm.  2+ radial pulse on the left.  PERTINENT LABORATORY AND RADIOLOGIC DATA  Most recent CBC    Latest Ref Rng & Units 08/28/2021   11:54 AM 11/12/2018    3:21 PM 11/20/2010    5:28 AM  CBC  WBC 4.0 - 10.5 K/uL 5.2  6.4  18.5   Hemoglobin 12.0 - 15.0 g/dL 11.1  13.8  10.8   Hematocrit 36.0 - 46.0 % 36.0  43.3  31.9   Platelets 150 - 400 K/uL 214  218  122      Most recent CMP    Latest Ref Rng & Units 08/28/2021   11:54 AM 11/12/2018    3:21 PM  CMP  Glucose 70 - 99 mg/dL 85  95   BUN 6 - 20 mg/dL 12  12   Creatinine 0.44 - 1.00 mg/dL 0.94  0.77   Sodium 135 - 145 mmol/L 139  139   Potassium 3.5 - 5.1 mmol/L 3.7  3.4   Chloride 98 - 111 mmol/L 105  106   CO2 22 - 32 mmol/L 25  23   Calcium 8.9 - 10.3 mg/dL 9.3  9.3   Total Protein 6.5 - 8.1 g/dL 6.7    Total Bilirubin 0.3 - 1.2 mg/dL 0.4    Alkaline Phos 38 - 126 U/L 72    AST 15 - 41 U/L 33    ALT 0 - 44 U/L 30      Renal function Estimated Creatinine Clearance: 69.3 mL/min (by C-G formula based on SCr of 0.94 mg/dL).  Venous duplex shows  occlusive  thrombus in the left subclavian and axillary vein.  CT scan of the chest no pulmonary emboli.  No central venous stenosis.  Wendy Aline. Stanford Breed, MD [facility] Phone Number: [phone] 08/28/2021 4:54 PM  Total time spent on preparing this encounter including chart review, data review, collecting history, examining the patient, coordinating care for this new patient, 60 minutes.  Portions of this report may have been transcribed using voice recognition software.  Every effort has been made to ensure accuracy; however, inadvertent computerized transcription errors may still be present.

## 2021-08-28 NOTE — ED Notes (Signed)
Monitor on

## 2021-08-28 NOTE — Transfer of Care (Signed)
Immediate Anesthesia Transfer of Care Note  Patient: Wendy Lin  Procedure(s) Performed: LEFT ARM VENOGRAM INITIATION OF THROMBOLYSIS (Left)  Patient Location: PACU  Anesthesia Type:MAC  Level of Consciousness: awake and alert   Airway & Oxygen Therapy: Patient Spontanous Breathing  Post-op Assessment: Report given to RN and Post -op Vital signs reviewed and stable  Post vital signs: Reviewed and stable  Last Vitals:  Vitals Value Taken Time  BP 109/64 08/28/21 2001  Temp    Pulse 69 08/28/21 2001  Resp 14 08/28/21 2001  SpO2 100 % 08/28/21 2001  Vitals shown include unvalidated device data.  Last Pain:  Vitals:   08/28/21 1813  TempSrc:   PainSc: 6       Patients Stated Pain Goal: 3 (50/03/70 4888)  Complications: No notable events documented.

## 2021-08-28 NOTE — ED Triage Notes (Signed)
Patient arrives POV ambulatory c/o left shoulder pain onset about 10 days ago that moved into her elbow. Patient states she has recently started lifting weights and playing tennis. Reports nausea onset of this morning.

## 2021-08-28 NOTE — Progress Notes (Signed)
ANTICOAGULATION CONSULT NOTE - Initial Consult  Pharmacy Consult for heparin Indication:  subclavian vein thrombus  No Known Allergies  Patient Measurements: Height: 5' 6"  (167.6 cm) Weight: 63 kg (138 lb 14.2 oz) IBW/kg (Calculated) : 59.3 Heparin Dosing Weight: 63kg  Vital Signs: Temp: 98.2 F (36.8 C) (06/22 2030) Temp Source: Oral (06/22 1716) BP: 108/69 (06/22 2030) Pulse Rate: 55 (06/22 2030)  Labs: Recent Labs    08/28/21 1154 08/28/21 1705 08/28/21 2141  HGB 11.1* 11.8* 11.3*  HCT 36.0 36.4 35.2*  PLT 214 234 212  LABPROT  --  14.2  --   INR  --  1.1  --   HEPARINUNFRC  --   --  0.16*  CREATININE 0.94 0.86  --   CKTOTAL 162  --   --   TROPONINIHS <2  --   --      Estimated Creatinine Clearance: 75.7 mL/min (by C-G formula based on SCr of 0.86 mg/dL).   Medical History: Past Medical History:  Diagnosis Date   Abnormal Pap smear    ASCUS, low grade squamous intraepithelial lesion   AMA (advanced maternal age) multigravida 35+    Anxiety    Blood transfusion complicating pregnancy 68/15/9470   s/p uterine atony   Low-lying placenta    Postpartum care following vaginal delivery (9/12) 11/20/2010   Seasonal allergies     Medications:  Infusions:   sodium chloride     alteplase (LIMB ISCHEMIA) 10 mg in normal saline (0.02 mg/mL) infusion     heparin      Assessment: 3 yof presented to the ED with arm pain. 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 Baseline Hgb is low at 11.1 but platelets are WNL. She is not on anticoagulation PTA.  Patient went to the OR and a catheter was placed, now receiving intra-arterial alteplase and systemic low-dose heparin.  Heparin level of 0.16 - only reflective of 3 hours of current drip rate; therefore not at steady state. Will not adjust drip rate for now and reassess in 6 hours.  Goal of Therapy:  Heparin level 0.2-0.5 units/ml while on thrombolytic infusion  Monitor platelets by  anticoagulation protocol: Yes   Plan:   Continue Heparin gtt 800 units/hr Check a 6 hr heparin level  Alanda Slim, PharmD, [place] Clinical Pharmacist Please see AMION for all Pharmacists' Contact Phone Numbers 08/28/2021, 10:31 PM

## 2021-08-29 ENCOUNTER — Other Ambulatory Visit (HOSPITAL_COMMUNITY): Payer: Self-pay

## 2021-08-29 ENCOUNTER — Inpatient Hospital Stay (HOSPITAL_COMMUNITY)
Admission: EM | Disposition: A | Discharge status: Home / Self Care | Payer: Self-pay | Source: Home / Self Care | Attending: Vascular Surgery

## 2021-08-29 DIAGNOSIS — Z975 Presence of (intrauterine) contraceptive device: Secondary | ICD-10-CM | POA: Diagnosis not present

## 2021-08-29 DIAGNOSIS — Z818 Family history of other mental and behavioral disorders: Secondary | ICD-10-CM | POA: Diagnosis not present

## 2021-08-29 DIAGNOSIS — Z821 Family history of blindness and visual loss: Secondary | ICD-10-CM | POA: Diagnosis not present

## 2021-08-29 DIAGNOSIS — Z8042 Family history of malignant neoplasm of prostate: Secondary | ICD-10-CM | POA: Diagnosis not present

## 2021-08-29 DIAGNOSIS — Z8249 Family history of ischemic heart disease and other diseases of the circulatory system: Secondary | ICD-10-CM | POA: Diagnosis not present

## 2021-08-29 DIAGNOSIS — Z79899 Other long term (current) drug therapy: Secondary | ICD-10-CM | POA: Diagnosis not present

## 2021-08-29 DIAGNOSIS — Z820 Family history of epilepsy and other diseases of the nervous system: Secondary | ICD-10-CM | POA: Diagnosis not present

## 2021-08-29 DIAGNOSIS — Z825 Family history of asthma and other chronic lower respiratory diseases: Secondary | ICD-10-CM | POA: Diagnosis not present

## 2021-08-29 DIAGNOSIS — Z803 Family history of malignant neoplasm of breast: Secondary | ICD-10-CM | POA: Diagnosis not present

## 2021-08-29 DIAGNOSIS — I82A12 Acute embolism and thrombosis of left axillary vein: Secondary | ICD-10-CM | POA: Diagnosis not present

## 2021-08-29 DIAGNOSIS — G540 Brachial plexus disorders: Secondary | ICD-10-CM | POA: Diagnosis not present

## 2021-08-29 DIAGNOSIS — I82B12 Acute embolism and thrombosis of left subclavian vein: Secondary | ICD-10-CM | POA: Diagnosis not present

## 2021-08-29 HISTORY — PX: PERIPHERAL VASCULAR THROMBECTOMY: CATH118306

## 2021-08-29 LAB — BASIC METABOLIC PANEL
Anion gap: 8 (ref 5–15)
BUN: 10 mg/dL (ref 6–20)
CO2: 19 mmol/L — ABNORMAL LOW (ref 22–32)
Calcium: 7.4 mg/dL — ABNORMAL LOW (ref 8.9–10.3)
Chloride: 114 mmol/L — ABNORMAL HIGH (ref 98–111)
Creatinine, Ser: 0.69 mg/dL (ref 0.44–1.00)
GFR, Estimated: >60 mL/min (ref >60)
Glucose, Bld: 85 mg/dL (ref 70–99)
Potassium: 3.2 mmol/L — ABNORMAL LOW (ref 3.5–5.1)
Sodium: 141 mmol/L (ref 135–145)

## 2021-08-29 LAB — CBC
HCT: 32.5 % — ABNORMAL LOW (ref 36.0–46.0)
HCT: 30.5 % — ABNORMAL LOW (ref 36.0–46.0)
Hemoglobin: 10.1 g/dL — ABNORMAL LOW (ref 12.0–15.0)
Hemoglobin: 9.2 g/dL — ABNORMAL LOW (ref 12.0–15.0)
MCH: 28.3 pg (ref 26.0–34.0)
MCH: 27.6 pg (ref 26.0–34.0)
MCHC: 31.1 g/dL (ref 30.0–36.0)
MCHC: 30.2 g/dL (ref 30.0–36.0)
MCV: 91 fL (ref 80.0–100.0)
MCV: 91.6 fL (ref 80.0–100.0)
Platelets: 156 10*3/uL (ref 150–400)
Platelets: 147 10*3/uL — ABNORMAL LOW (ref 150–400)
RBC: 3.57 MIL/uL — ABNORMAL LOW (ref 3.87–5.11)
RBC: 3.33 MIL/uL — ABNORMAL LOW (ref 3.87–5.11)
RDW: 13.4 % (ref 11.5–15.5)
RDW: 13.4 % (ref 11.5–15.5)
WBC: 5.4 10*3/uL (ref 4.0–10.5)
WBC: 4.5 10*3/uL (ref 4.0–10.5)
nRBC: 0 % (ref 0.0–0.2)
nRBC: 0 % (ref 0.0–0.2)

## 2021-08-29 LAB — FIBRINOGEN
Fibrinogen: 203 mg/dL — ABNORMAL LOW (ref 210–475)
Fibrinogen: 174 mg/dL — ABNORMAL LOW (ref 210–475)

## 2021-08-29 LAB — HEPARIN LEVEL (UNFRACTIONATED)
Heparin Unfractionated: 0.14 IU/mL — ABNORMAL LOW (ref 0.30–0.70)
Heparin Unfractionated: 0.18 IU/mL — ABNORMAL LOW (ref 0.30–0.70)

## 2021-08-29 SURGERY — PERIPHERAL VASCULAR THROMBECTOMY
Anesthesia: LOCAL

## 2021-08-29 MED ORDER — HEPARIN (PORCINE) IN NACL 1000-0.9 UT/500ML-% IV SOLN
INTRAVENOUS | Status: DC | PRN
  Administered 2021-08-29: 500 mL

## 2021-08-29 MED ORDER — LIDOCAINE HCL (PF) 1 % IJ SOLN
INTRAMUSCULAR | Status: DC | PRN
  Administered 2021-08-29: 2 mL

## 2021-08-29 MED ORDER — HEPARIN (PORCINE) IN NACL 1000-0.9 UT/500ML-% IV SOLN
INTRAVENOUS | Status: AC
  Filled 2021-08-29: qty 500

## 2021-08-29 MED ORDER — MIDAZOLAM HCL 2 MG/2ML IJ SOLN
INTRAMUSCULAR | Status: DC | PRN
  Administered 2021-08-29: 1 mg via INTRAVENOUS

## 2021-08-29 MED ORDER — FENTANYL CITRATE (PF) 100 MCG/2ML IJ SOLN
INTRAMUSCULAR | Status: DC | PRN
  Administered 2021-08-29: 50 ug via INTRAVENOUS

## 2021-08-29 MED ORDER — MIDAZOLAM HCL 2 MG/2ML IJ SOLN
INTRAMUSCULAR | Status: AC
  Filled 2021-08-29: qty 2

## 2021-08-29 MED ORDER — IODIXANOL 320 MG/ML IV SOLN
INTRAVENOUS | Status: DC | PRN
  Administered 2021-08-29: 20 mL

## 2021-08-29 MED ORDER — LIDOCAINE HCL (PF) 1 % IJ SOLN
INTRAMUSCULAR | Status: AC
  Filled 2021-08-29: qty 30

## 2021-08-29 MED ORDER — FENTANYL CITRATE (PF) 100 MCG/2ML IJ SOLN
INTRAMUSCULAR | Status: AC
  Filled 2021-08-29: qty 2

## 2021-08-29 MED ORDER — SODIUM CHLORIDE 0.9 % IV SOLN: INTRAVENOUS | Status: DC

## 2021-08-29 MED ORDER — RIVAROXABAN (XARELTO) VTE STARTER PACK (15 & 20 MG): ORAL_TABLET | ORAL | 0 refills | Status: DC

## 2021-08-29 MED ORDER — CHLORHEXIDINE GLUCONATE CLOTH 2 % EX PADS: 6.0000 | MEDICATED_PAD | Freq: Every day | CUTANEOUS | Status: DC

## 2021-08-29 MED ORDER — POTASSIUM CHLORIDE CRYS ER 20 MEQ PO TBCR
40.0000 meq | EXTENDED_RELEASE_TABLET | ORAL | Status: DC
  Administered 2021-08-29: 40 meq via ORAL
  Filled 2021-08-29: qty 2

## 2021-08-29 MED ORDER — ACETAMINOPHEN 325 MG PO TABS
650.0000 mg | ORAL_TABLET | Freq: Four times a day (QID) | ORAL | Status: DC | PRN
Start: 2021-08-29 — End: 2021-08-29
  Administered 2021-08-29: 650 mg via ORAL
  Filled 2021-08-29: qty 2

## 2021-08-29 MED ORDER — ONDANSETRON HCL 4 MG/2ML IJ SOLN
INTRAMUSCULAR | Status: AC
  Filled 2021-08-29: qty 2

## 2021-08-29 SURGICAL SUPPLY — 6 items
BAG SNAP BAND KOVER 36X36 (MISCELLANEOUS) ×1 IMPLANT
COVER DOME SNAP 22 D (MISCELLANEOUS) ×1 IMPLANT
GLIDEWIRE ADV .035X260CM (WIRE) ×1 IMPLANT
PROTECTION STATION PRESSURIZED (MISCELLANEOUS) ×2
STATION PROTECTION PRESSURIZED (MISCELLANEOUS) IMPLANT
TRAY PV CATH (CUSTOM PROCEDURE TRAY) ×1 IMPLANT

## 2021-08-29 NOTE — Discharge Summary (Signed)
Physician Discharge Summary  Patient ID: Wendy Lin MRN: [id number] DOB/AGE: [birth year] 46 y.o.  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 Underwent venous thrombolysis overnight 6/22 - 6/23. Patient tolerated this well. Recheck 6/23 shows resolution of thrombosis. Discharged POD#1.  Consults: None  Significant Diagnostic Studies: angiography as above  Treatments: thrombolysis as able  Discharge Exam: Blood pressure 112/63, pulse 63, temperature 99.1 F (37.3 C), temperature source Oral, resp. rate (!) 24, height 5\' 6"  (1.676 m), weight 66.9 kg, SpO2 97 %. Constitutional: well appearing in no distress.  Neurologic: CN intact. no focal findings. no sensory loss. Psychiatric: Mood and affect symmetric and appropriate. Eyes: No icterus. No conjunctival pallor. Ears, nose, throat: mucous membranes moist. Midline trachea.  Cardiac: regular rate and rhythm.  Respiratory: unlabored. Abdominal: soft, non-tender, non-distended.  Peripheral vascular:  2+ radial pulses Extremity: No edema. No cyanosis. No pallor.  Skin: No gangrene. No ulceration.  Lymphatic: No Stemmer's sign. No palpable lymphadenopathy.   Disposition: Discharge disposition: 01-Home or Self Care       Discharge Instructions     Call MD for:  redness, tenderness, or signs of infection (pain, swelling, bleeding, redness, odor or green/yellow discharge around incision site)   Complete by: As directed    Call MD for:  severe or increased pain, loss or decreased feeling  in affected limb(s)   Complete by: As directed    Call MD for:  temperature >100.5   Complete by: As directed    Driving Restrictions   Complete by: As directed    OK to drive.   Lifting restrictions    Complete by: As directed    No heavy lifting with left arm until further notice. Ok for other strenuous activity.   Resume previous diet   Complete by: As directed       Allergies as of 08/29/2021   No Known Allergies      Medication List     TAKE these medications    ibuprofen 200 MG tablet Commonly known as: ADVIL Take 200 mg by mouth every 6 (six) hours as needed.   levonorgestrel 20 MCG/24HR IUD Commonly known as: MIRENA by Intrauterine route.   MAGNESIUM PO Take 1 tablet by mouth daily.   MULTIVITAMIN PO Take 1 tablet by mouth daily.   Rivaroxaban Stater Pack (15 mg and 20 mg) Commonly known as: XARELTO STARTER PACK Follow package directions: Take one 15mg  tablet by mouth twice a day. On day 22, switch to one 20mg  tablet once a day. Take with food.   sertraline 25 MG tablet Commonly known as: ZOLOFT Take 25 mg by mouth daily.   triamcinolone 55 MCG/ACT Aero nasal inhaler Commonly known as: NASACORT Place 2 sprays into the nose daily.         Signed: Leonie Douglas 08/29/2021, 3:59 PM

## 2021-08-29 NOTE — Plan of Care (Signed)
Pt back from cath lab.  Discharge instructions explained and all LDA's removed.  Pt awaiting transportation home.   Problem: Education: Goal: Knowledge of General Education information will improve Description: Including pain rating scale, medication(s)/side effects and non-pharmacologic comfort measures Outcome: Adequate for Discharge

## 2021-08-29 NOTE — Progress Notes (Addendum)
ANTICOAGULATION CONSULT NOTE  Pharmacy Consult for heparin Indication:  subclavian vein thrombus  No Known Allergies  Patient Measurements: Height: 5\' 6"  (167.6 cm) Weight: 66.9 kg (147 lb 7.8 oz) IBW/kg (Calculated) : 59.3 Heparin Dosing Weight: 63kg  Vital Signs: Temp: 98.8 F (37.1 C) (06/23 0800) Temp Source: Oral (06/23 0800) BP: 106/61 (06/23 1100) Pulse Rate: 47 (06/23 1100)  Labs: Recent Labs    08/28/21 1154 08/28/21 1605 08/28/21 1705 08/28/21 2141 08/29/21 0327 08/29/21 0351 08/29/21 0858  HGB 11.1*  --  11.8* 11.3*  --  10.1* 9.2*  HCT 36.0  --  36.4 35.2*  --  32.5* 30.5*  PLT 214  --  234 212  --  156 147*  LABPROT  --   --  14.2  --   --   --   --   INR  --   --  1.1  --   --   --   --   HEPARINUNFRC  --   --   --  0.16* 0.14*  --  0.18*  CREATININE 0.94  --  0.86  --   --  0.69  --   CKTOTAL 162  --   --   --   --   --   --   TROPONINIHS <2 4  --   --   --   --   --      Estimated Creatinine Clearance: 81.4 mL/min (by C-G formula based on SCr of 0.69 mg/dL).   Medical History: Past Medical History:  Diagnosis Date   Abnormal Pap smear    ASCUS, low grade squamous intraepithelial lesion   AMA (advanced maternal age) multigravida 35+    Anxiety    Blood transfusion complicating pregnancy 03/09/2005   s/p uterine atony   Low-lying placenta    Postpartum care following vaginal delivery (9/12) 11/20/2010   Seasonal allergies     Medications:  Infusions:   sodium chloride Stopped (08/29/21 0845)   sodium chloride 100 mL/hr at 08/29/21 1000   alteplase (LIMB ISCHEMIA) 10 mg in normal saline (0.02 mg/mL) infusion 1 mg/hr (08/29/21 1000)   heparin 950 Units/hr (08/29/21 1000)    Assessment: 45 yof presented to the ED with arm pain. Found to have a subclavian vein thrombus and on IV heparin.  She is not on anticoagulation PTA. -heparin level below goal at 0.18 on 950 units/hr -hg= 9.2  High cost of apixaban due to a high deductible but she  has Nurse, learning disability so she will be able to use the monthly copay card  Goal of Therapy:  Heparin level 0.2-0.5 Monitor platelets by anticoagulation protocol: Yes   Plan:  -Increase heparin to 1100 units/hr -Will follow plans after lysis check today  Harland German, PharmD Clinical Pharmacist **Pharmacist phone directory can now be found on [email or web address] ([email or web address]).  Listed under [facility].

## 2021-08-30 ENCOUNTER — Encounter (HOSPITAL_COMMUNITY): Payer: Self-pay | Admitting: Vascular Surgery

## 2021-09-01 ENCOUNTER — Telehealth: Payer: Self-pay

## 2021-09-02 MED FILL — Ondansetron HCl Inj 4 MG/2ML (2 MG/ML): INTRAMUSCULAR | Qty: 2 | Status: AC

## 2021-09-19 NOTE — Progress Notes (Unsigned)
HISTORY AND PHYSICAL     CC:  follow up Requesting Provider:  Jolinda Croak, MD  HPI: Wendy Lin is a 46 y.o. (Jan 23, 1976) female who was originally seen on 08/28/2021 by Dr. Stanford Breed.  She presented with of left arm pain and swelling over the past week.  The patient is physically fit and has been exercising with more of a focus on weight training in the past several months.  She noticed pain and swelling develop 4 to 5 days ago.  She initially ascribed this to muscle soreness, but this became progressively worse.  She was evaluated today in the draw bridge ER where duplex ultrasound showed acute deep venous thrombosis of the left subclavian and axillary veins.   The patient reports no personal or family history of thrombophilia.  She denies any recent trauma.  She denies pregnancy.  No recent surgeries.  On 08/28/2021, she was taken for LUE and central venogram with intiation of catheter directed thrombolysis by Dr. Stanford Breed.  She was found to have occlusive thrombus throughout the left arm.  She was taken back the next day for repeat left arm venogram and was found to have resolution of her left subclavian and axillary vein thrombosis.  There was re-demonstration of severe stenosis at the thoracic outlet.   She was started on Xarelto and scheduled to follow up for discussion for 1st rib resection.   The pt *** on a statin for cholesterol management.  The pt *** on a daily aspirin.   Other AC:  *** The pt *** on *** for hypertension.   The pt *** diabetic.   Tobacco hx:  ***  Pt does *** have family hx of AAA.  Past Medical History:  Diagnosis Date   Abnormal Pap smear    ASCUS, low grade squamous intraepithelial lesion   AMA (advanced maternal age) multigravida 35+    Anxiety    Blood transfusion complicating pregnancy [id number]   s/p uterine atony   Low-lying placenta    Postpartum care following vaginal delivery (9/12) 11/20/2010   Seasonal allergies     Past Surgical  History:  Procedure Laterality Date   AV FISTULA PLACEMENT Left 08/28/2021   Procedure: LEFT ARM VENOGRAM INITIATION OF THROMBOLYSIS;  Surgeon: Cherre Robins, MD;  Location: [place];  Service: Vascular;  Laterality: Left;   COLPOSCOPY  2017   INTRAUTERINE DEVICE (IUD) INSERTION     mirena inserted 12/17   [place]   PERIPHERAL VASCULAR THROMBECTOMY N/A 08/29/2021   Procedure: LYSIS RECHECK;  Surgeon: Cherre Robins, MD;  Location: [place] [facility];  Service: Cardiovascular;  Laterality: N/A;   WISDOM TOOTH EXTRACTION      Social History   Socioeconomic History   Marital status: Single    Spouse name: Not on file   Number of children: Not on file   Years of education: Not on file   Highest education level: Not on file  Occupational History   Not on file  Tobacco Use   Smoking status: Never   Smokeless tobacco: Never  Vaping Use   Vaping Use: Never used  Substance and Sexual Activity   Alcohol use: Yes    Comment: SOCIAL   Drug use: No   Sexual activity: Yes    Partners: Male    Birth control/protection: I.U.D.    Comment: Mirena IUD  Other Topics Concern   Not on file  Social History Narrative   Not on file   Social Determinants of Health   Financial Resource Strain: Not on file  Food Insecurity: Not on file  Transportation Needs: Not on file  Physical Activity: Not on file  Stress: Not on file  Social Connections: Not on file  Intimate Partner Violence: Not on file    *** Family History  Problem Relation Age of Onset   Mitral valve prolapse Mother    Seizures Mother        in pregnancy   Heart disease Mother    Cancer Mother        breast   Parkinson's disease Mother    Dementia Mother    Vision loss Father        glacoma   Mitral valve prolapse Sister    Depression Sister    Migraines Sister    Mental illness Sister    Birth defects Maternal Aunt        Breast   Mitral valve prolapse Maternal Uncle     Heart disease Maternal Uncle    Heart disease Maternal Grandmother    COPD Maternal Grandfather    Vision loss Maternal Grandfather        glacoma   Cancer Paternal Grandfather        prostate ca    Current Outpatient Medications  Medication Sig Dispense Refill   ibuprofen (ADVIL) 200 MG tablet Take 200 mg by mouth every 6 (six) hours as needed.     levonorgestrel (MIRENA) 20 MCG/24HR IUD by Intrauterine route.     MAGNESIUM PO Take 1 tablet by mouth daily.     Multiple Vitamin (MULTIVITAMIN PO) Take 1 tablet by mouth daily.     RIVAROXABAN (XARELTO) VTE STARTER PACK (15 & 20 MG) Follow package directions: Take one 15mg  tablet by mouth twice a day. On day 22, switch to one 20mg  tablet once a day. Take with food. 51 each 0   sertraline (ZOLOFT) 25 MG tablet Take 25 mg by mouth daily.     triamcinolone (NASACORT) 55 MCG/ACT AERO nasal inhaler Place 2 sprays into the nose daily.     No current facility-administered medications for this visit.    No Known Allergies   REVIEW OF SYSTEMS:  *** [X]  denotes positive finding, [ ]  denotes negative finding Cardiac  Comments:  Chest pain or chest pressure:    Shortness of breath upon exertion:    Short of breath when lying flat:    Irregular heart rhythm:        Vascular    Pain in calf, thigh, or hip brought on by ambulation:    Pain in feet at night that wakes you up from your sleep:     Blood clot in your veins:    Leg swelling:         Pulmonary    Oxygen at home:    Productive cough:     Wheezing:         Neurologic    Sudden weakness in arms or legs:     Sudden numbness in arms or legs:     Sudden onset of difficulty speaking or slurred speech:    Temporary loss of vision in one eye:     Problems with dizziness:         Gastrointestinal    Blood in stool:     Vomited blood:         Genitourinary    Burning when urinating:  Blood in urine:        Psychiatric    Major depression:         Hematologic     Bleeding problems:    Problems with blood clotting too easily:        Skin    Rashes or ulcers:        Constitutional    Fever or chills:      PHYSICAL EXAMINATION:  ***  General:  WDWN in NAD; vital signs documented above Gait: Not observed HENT: WNL, normocephalic Pulmonary: normal non-labored breathing Cardiac: {Desc; regular/irreg:14544} HR, {With/Without:20273} carotid bruit*** Abdomen: soft, NT, no masses; aortic pulse is *** palpable Skin: {With/Without:20273} rashes Vascular Exam/Pulses:  Right Left  Radial {Exam; arterial pulse strength 0-4:30167} {Exam; arterial pulse strength 0-4:30167}  Femoral {Exam; arterial pulse strength 0-4:30167} {Exam; arterial pulse strength 0-4:30167}  Popliteal {Exam; arterial pulse strength 0-4:30167} {Exam; arterial pulse strength 0-4:30167}  DP {Exam; arterial pulse strength 0-4:30167} {Exam; arterial pulse strength 0-4:30167}  PT {Exam; arterial pulse strength 0-4:30167} {Exam; arterial pulse strength 0-4:30167}   Extremities: {With/Without:20273} ischemic changes, {With/Without:20273} Gangrene , {With/Without:20273} cellulitis; {With/Without:20273} open wounds;  Musculoskeletal: no muscle wasting or atrophy  Neurologic: A&O X 3;  No focal weakness or paresthesias are detected; speech fluent/normal Psychiatric:  The pt has {Desc; normal/abnormal:11317::"Normal"} affect.   Non-Invasive Vascular Imaging on ***:   ***    ASSESSMENT/PLAN:: 46 y.o. female here for follow up for ***   -***   Doreatha Massed, [facility] [phone]  Clinic MD:   Chestine Spore

## 2021-09-23 ENCOUNTER — Ambulatory Visit (INDEPENDENT_AMBULATORY_CARE_PROVIDER_SITE_OTHER): Payer: 59 | Admitting: Vascular Surgery

## 2021-09-23 ENCOUNTER — Encounter: Payer: Self-pay | Admitting: Vascular Surgery

## 2021-09-23 VITALS — BP 132/80 | HR 56 | Temp 99.0°F | Resp 20 | Ht 66.0 in | Wt 143.0 lb

## 2021-09-23 DIAGNOSIS — I871 Compression of vein: Secondary | ICD-10-CM | POA: Diagnosis not present

## 2021-09-23 MED ORDER — APIXABAN 5 MG PO TABS: 5.0000 mg | ORAL_TABLET | Freq: Two times a day (BID) | ORAL | 3 refills | Status: DC

## 2021-10-31 DIAGNOSIS — Z111 Encounter for screening for respiratory tuberculosis: Secondary | ICD-10-CM | POA: Diagnosis not present

## 2021-11-20 ENCOUNTER — Encounter: Payer: Self-pay | Admitting: Vascular Surgery

## 2021-12-15 NOTE — Progress Notes (Unsigned)
HISTORY AND PHYSICAL     CC:  follow up Requesting Provider:  Jolinda Croak, MD  HPI: Wendy Lin is a 46 y.o. (09/23/1975) female who was originally presented with of left arm pain and swelling over the a week.  The patient is physically fit and has been exercising with more of a focus on weight training in the past several months.  She noticed pain and swelling develop 4 to 5 days ago.  She initially ascribed this to muscle soreness, but this became progressively worse.  She was evaluated today in the [facility] ER where duplex ultrasound showed acute deep venous thrombosis of the left subclavian and axillary veins.   The patient reports no personal or family history of thrombophilia.  She denies any recent trauma.  She denies pregnancy.  No recent surgeries.  On 08/28/2021, she was taken for LUE and central venogram with intiation of catheter directed thrombolysis.  She was found to have occlusive thrombus throughout the left arm.  She was taken back the next day for repeat left arm venogram and was found to have resolution of her left subclavian and axillary vein thrombosis.  There was re-demonstration of severe stenosis at the thoracic outlet.   She was started on Xarelto and scheduled to follow up for discussion for 1st rib resection.   Past Medical History:  Diagnosis Date   Abnormal Pap smear    ASCUS, low grade squamous intraepithelial lesion   AMA (advanced maternal age) multigravida 35+    Anxiety    Blood transfusion complicating pregnancy 16/12/9602   s/p uterine atony   Low-lying placenta    Postpartum care following vaginal delivery (9/12) 11/20/2010   Seasonal allergies     Past Surgical History:  Procedure Laterality Date   AV FISTULA PLACEMENT Left 08/28/2021   Procedure: LEFT ARM VENOGRAM INITIATION OF THROMBOLYSIS;  Surgeon: Cherre Robins, MD;  Location: [facility];  Service: Vascular;  Laterality: Left;   COLPOSCOPY  2017   INTRAUTERINE DEVICE (IUD)  INSERTION     mirena inserted 12/17   [place]   PERIPHERAL VASCULAR THROMBECTOMY N/A 08/29/2021   Procedure: LYSIS RECHECK;  Surgeon: Cherre Robins, MD;  Location: [facility];  Service: Cardiovascular;  Laterality: N/A;   WISDOM TOOTH EXTRACTION      Social History   Socioeconomic History   Marital status: Single    Spouse name: Not on file   Number of children: Not on file   Years of education: Not on file   Highest education level: Not on file  Occupational History   Not on file  Tobacco Use   Smoking status: Never   Smokeless tobacco: Never  Vaping Use   Vaping Use: Never used  Substance and Sexual Activity   Alcohol use: Yes    Comment: SOCIAL   Drug use: No   Sexual activity: Yes    Partners: Male    Birth control/protection: I.U.D.    Comment: Mirena IUD  Other Topics Concern   Not on file  Social History Narrative   Not on file   Social Determinants of Health   Financial Resource Strain: Not on file  Food Insecurity: Not on file  Transportation Needs: Not on file  Physical Activity: Not on file  Stress: Not on file  Social Connections: Not on file  Intimate Partner Violence: Not on file   Family History  Problem Relation Age of Onset   Mitral valve prolapse Mother    Seizures Mother        in pregnancy   Heart disease Mother    Cancer Mother        breast   Parkinson's disease Mother    Dementia Mother    Vision loss Father        glacoma   Mitral valve prolapse Sister    Depression Sister    Migraines Sister    Mental illness Sister    Birth defects Maternal Aunt        Breast   Mitral valve prolapse Maternal Uncle    Heart disease Maternal Uncle    Heart disease Maternal Grandmother    COPD Maternal Grandfather    Vision loss Maternal Grandfather        glacoma   Cancer Paternal Grandfather        prostate ca    Current Outpatient Medications  Medication Sig Dispense Refill    apixaban (ELIQUIS) 5 MG TABS tablet Take 1 tablet (5 mg total) by mouth 2 (two) times daily. 60 tablet 3   ibuprofen (ADVIL) 200 MG tablet Take 200 mg by mouth every 6 (six) hours as needed.     levonorgestrel (MIRENA) 20 MCG/24HR IUD by Intrauterine route.     MAGNESIUM PO Take 1 tablet by mouth daily.     Multiple Vitamin (MULTIVITAMIN PO) Take 1 tablet by mouth daily.     sertraline (ZOLOFT) 25 MG tablet Take 25 mg by mouth daily.     triamcinolone (NASACORT) 55 MCG/ACT AERO nasal inhaler Place 2 sprays into the nose daily.     No current facility-administered medications for this visit.    No Known Allergies  PHYSICAL EXAMINATION: There were no vitals taken for this visit.  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  We had an extensive conversation about venogram findings, venous thoracic outlet syndrome, periclavicular and axillary approaches to first rib excision, the rationale for first rib excision, and expected postoperative course and recovery.  I offered her left transaxillary first rib excision with venoplasty as early as next week.  She would like to get a second opinion.  I suggested she speak with Dr. Thora Lance at [facility].  I will see her again if she wishes to pursue first rib excision with me.  Otherwise I will be happy to care for her locally after her first rib excision at [facility].  Rande Brunt. Lenell Antu, MD [facility] Phone Number: [phone] 12/15/2021 7:47 PM

## 2021-12-16 ENCOUNTER — Ambulatory Visit (INDEPENDENT_AMBULATORY_CARE_PROVIDER_SITE_OTHER): Payer: 59 | Admitting: Vascular Surgery

## 2021-12-16 ENCOUNTER — Encounter: Payer: Self-pay | Admitting: Vascular Surgery

## 2021-12-16 VITALS — BP 118/77 | HR 69 | Temp 99.0°F | Resp 20 | Ht 66.0 in | Wt 145.0 lb

## 2021-12-16 DIAGNOSIS — I871 Compression of vein: Secondary | ICD-10-CM | POA: Diagnosis not present

## 2021-12-16 DIAGNOSIS — G540 Brachial plexus disorders: Secondary | ICD-10-CM

## 2022-01-19 DIAGNOSIS — D225 Melanocytic nevi of trunk: Secondary | ICD-10-CM | POA: Diagnosis not present

## 2022-01-19 DIAGNOSIS — L814 Other melanin hyperpigmentation: Secondary | ICD-10-CM | POA: Diagnosis not present

## 2022-01-19 DIAGNOSIS — B353 Tinea pedis: Secondary | ICD-10-CM | POA: Diagnosis not present

## 2022-01-19 DIAGNOSIS — D224 Melanocytic nevi of scalp and neck: Secondary | ICD-10-CM | POA: Diagnosis not present

## 2022-01-19 DIAGNOSIS — L819 Disorder of pigmentation, unspecified: Secondary | ICD-10-CM | POA: Diagnosis not present

## 2022-01-19 DIAGNOSIS — L218 Other seborrheic dermatitis: Secondary | ICD-10-CM | POA: Diagnosis not present

## 2022-01-19 DIAGNOSIS — L821 Other seborrheic keratosis: Secondary | ICD-10-CM | POA: Diagnosis not present

## 2022-01-19 DIAGNOSIS — D2261 Melanocytic nevi of right upper limb, including shoulder: Secondary | ICD-10-CM | POA: Diagnosis not present

## 2022-01-19 DIAGNOSIS — D2272 Melanocytic nevi of left lower limb, including hip: Secondary | ICD-10-CM | POA: Diagnosis not present

## 2022-01-19 DIAGNOSIS — D2262 Melanocytic nevi of left upper limb, including shoulder: Secondary | ICD-10-CM | POA: Diagnosis not present

## 2022-01-19 DIAGNOSIS — D2271 Melanocytic nevi of right lower limb, including hip: Secondary | ICD-10-CM | POA: Diagnosis not present

## 2022-01-19 DIAGNOSIS — D1801 Hemangioma of skin and subcutaneous tissue: Secondary | ICD-10-CM | POA: Diagnosis not present

## 2022-02-25 ENCOUNTER — Telehealth: Payer: Self-pay | Admitting: *Deleted

## 2022-02-25 NOTE — Telephone Encounter (Signed)
Patient called requesting ultrasound.  Patient states there was bluish color to her finger and numbness Saturday.  No symptoms at his time. I offered an appointment for a follow up with Dr Lenell Antu Tuesday, 03/03/2022 however patient states she will be out of town. I spoke with Dr Randie Heinz, verification of blood thinner, Eliquis was determined.  I advised patient to report to th ED if any worsening symptoms should occur.  Patient voiced understanding of the instructions.

## 2022-03-10 ENCOUNTER — Ambulatory Visit: Payer: 59 | Admitting: Vascular Surgery

## 2022-03-17 ENCOUNTER — Ambulatory Visit: Payer: 59 | Admitting: Vascular Surgery

## 2022-03-17 ENCOUNTER — Other Ambulatory Visit (HOSPITAL_COMMUNITY): Payer: 59

## 2022-03-26 ENCOUNTER — Other Ambulatory Visit: Payer: Self-pay | Admitting: Obstetrics & Gynecology

## 2022-03-26 ENCOUNTER — Encounter: Payer: Self-pay | Admitting: Obstetrics & Gynecology

## 2022-03-26 ENCOUNTER — Ambulatory Visit (INDEPENDENT_AMBULATORY_CARE_PROVIDER_SITE_OTHER): Payer: No Typology Code available for payment source | Admitting: Obstetrics & Gynecology

## 2022-03-26 ENCOUNTER — Other Ambulatory Visit (HOSPITAL_COMMUNITY)
Admission: RE | Admit: 2022-03-26 | Discharge: 2022-03-26 | Disposition: A | Discharge status: Home / Self Care | Payer: No Typology Code available for payment source | Source: Ambulatory Visit | Attending: Obstetrics & Gynecology | Admitting: Obstetrics & Gynecology

## 2022-03-26 VITALS — BP 110/76 | HR 60 | Ht 65.75 in | Wt 148.0 lb

## 2022-03-26 DIAGNOSIS — Z30431 Encounter for routine checking of intrauterine contraceptive device: Secondary | ICD-10-CM

## 2022-03-26 DIAGNOSIS — N870 Mild cervical dysplasia: Secondary | ICD-10-CM | POA: Diagnosis not present

## 2022-03-26 DIAGNOSIS — Z23 Encounter for immunization: Secondary | ICD-10-CM

## 2022-03-26 DIAGNOSIS — Z01419 Encounter for gynecological examination (general) (routine) without abnormal findings: Secondary | ICD-10-CM | POA: Insufficient documentation

## 2022-03-26 DIAGNOSIS — I871 Compression of vein: Secondary | ICD-10-CM

## 2022-03-26 DIAGNOSIS — Z1231 Encounter for screening mammogram for malignant neoplasm of breast: Secondary | ICD-10-CM

## 2022-03-26 DIAGNOSIS — N951 Menopausal and female climacteric states: Secondary | ICD-10-CM | POA: Diagnosis not present

## 2022-03-26 NOTE — Progress Notes (Signed)
Wendy Lin 05/05/1975 [id number]   History:    47 y.o. G2P2L2 Boyfriend x 5 yrs.  Children:  Son Levora Dredge in HS (6'1" tall).  Daughter 4th grade.   RP:  Established patient presenting for annual gyn exam    HPI: Well on Mirena IUD x 02/2016. No menses. No BTB.  No pelvic pain. Normal vaginal secretions.  No pain with IC.  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 HPV 16-18-45 neg.  Pap neg 03/2021.  Pap reflex today. Urine/BMs wnl. Breasts wnl. Mammo 12/2020, Lt Dx mammo/US 02/05/2021 Benign Cyst.  Schedule screening mammo now.  BMI 24.07.  Fit and healthy nutrition.  Health labs with Fam MD.  ColoGard Neg 2022. Flu vaccine given today.  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  Past medical history,surgical history, family history and social history were all reviewed and documented in the EPIC chart.  Gynecologic History No LMP recorded. (Menstrual status: IUD).  Obstetric History OB History  Gravida Para Term Preterm AB Living  2 2 2     2   SAB IAB Ectopic Multiple Live Births          2    # Outcome Date GA Lbr Len/2nd Weight Sex Delivery Anes PTL Lv  2 Term 11/19/10 [redacted]w[redacted]d 04:10 / 00:22 7 lb 15.3 oz (3.609 kg) M Vag-Spont EPI  LIV     Birth Comments: None  1 Term 2007 [redacted]w[redacted]d  7 lb 8 oz (3.402 kg) M Vag-Spont EPI  LIV     ROS: A ROS was performed and pertinent positives and negatives are included in the history. GENERAL: No fevers or chills. HEENT: No change in vision, no earache, sore throat or sinus congestion. NECK: No pain or stiffness. CARDIOVASCULAR: No chest pain or pressure. No palpitations. PULMONARY: No shortness of breath, cough or wheeze. GASTROINTESTINAL: No abdominal pain, nausea, vomiting or diarrhea, melena or bright red blood per rectum. GENITOURINARY: No urinary frequency, urgency, hesitancy or dysuria. MUSCULOSKELETAL: No joint or muscle pain, no back pain, no recent trauma. DERMATOLOGIC: No rash, no itching, no  lesions. ENDOCRINE: No polyuria, polydipsia, no heat or cold intolerance. No recent change in weight. HEMATOLOGICAL: No anemia or easy bruising or bleeding. NEUROLOGIC: No headache, seizures, numbness, tingling or weakness. PSYCHIATRIC: No depression, no loss of interest in normal activity or change in sleep pattern.     Exam:   BP 110/76   Pulse 60   Ht 5' 5.75" (1.67 m)   Wt 148 lb (67.1 kg)   SpO2 99%   BMI 24.07 kg/m   Body mass index is 24.07 kg/m.  General appearance : Well developed well nourished female. No acute distress HEENT: Eyes: no retinal hemorrhage or exudates,  Neck supple, trachea midline, no carotid bruits, no thyroidmegaly Lungs: Clear to auscultation, no rhonchi or wheezes, or rib retractions  Heart: Regular rate and rhythm, no murmurs or gallops Breast:Examined in sitting and supine position were symmetrical in appearance, no palpable masses or tenderness,  no skin retraction, no nipple inversion, no nipple discharge, no skin discoloration, no axillary or supraclavicular lymphadenopathy Abdomen: no palpable masses or tenderness, no rebound or guarding Extremities: no edema or skin discoloration or tenderness  Pelvic: Vulva: Normal             Vagina: No gross lesions or discharge  Cervix: No gross lesions or discharge.  IUD strings short, visible at EO.  Uterus  AV, normal size, shape and  consistency, non-tender and mobile  Adnexa  Without masses or tenderness  Anus: Normal   Assessment/Plan:  47 y.o. female for annual exam   1. Encounter for routine gynecological examination with Papanicolaou smear of cervix Well on Mirena IUD x 02/2016. No menses. No BTB.  No pelvic pain. Normal vaginal secretions.  No pain with IC.  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 HPV 16-18-45 neg.  Pap neg 03/2021.  Pap reflex today. Urine/BMs wnl. Breasts wnl. Mammo 12/2020, Lt Dx mammo/US 02/05/2021 Benign Cyst.  Schedule screening mammo now.  BMI 24.07.  Fit and healthy nutrition.  Health labs with Fam  MD.  ColoGard Neg 2022. Flu vaccine given today.  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 - Cytology - PAP( [place])  2. Dysplasia of cervix, low grade (CIN 1) - Cytology - PAP( [place])  3. Encounter for routine checking of intrauterine contraceptive device (IUD) Well on Mirena IUD x 02/2016. No menses. No BTB.  No pelvic pain. Normal vaginal secretions.  No pain with IC. IUD in good position.  4. Perimenopause No menses on the Mirena IUD.  Will check FSH today. - FSH  5. 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 Will change from Mirena IUD to Paragard if advised by Cardio-Thoracic Surgeon.  6. Need for immunization against influenza - Flu Vaccine QUAD 34mo+IM (Fluarix, Fluzone & Alfiuria Quad PF)  Other orders - tretinoin (RETIN-A) 0.05 % cream; Apply 1 Application topically at bedtime. - sertraline (ZOLOFT) 50 MG tablet; Take 25 mg by mouth daily. - Azelastine HCl (ASTEPRO NA); Place into the nose as needed.   Princess Bruins MD, 10:42 AM

## 2022-03-27 ENCOUNTER — Encounter: Payer: Self-pay | Admitting: *Deleted

## 2022-03-27 LAB — FOLLICLE STIMULATING HORMONE: FSH: 2.2 m[IU]/mL

## 2022-04-01 LAB — CYTOLOGY - PAP
Comment: NEGATIVE
Diagnosis: UNDETERMINED — AB
High risk HPV: NEGATIVE

## 2022-04-03 ENCOUNTER — Other Ambulatory Visit: Payer: Self-pay

## 2022-04-03 DIAGNOSIS — R87610 Atypical squamous cells of undetermined significance on cytologic smear of cervix (ASC-US): Secondary | ICD-10-CM

## 2022-04-14 ENCOUNTER — Encounter: Payer: Self-pay | Admitting: Obstetrics & Gynecology

## 2022-04-23 ENCOUNTER — Telehealth: Payer: Self-pay | Admitting: Obstetrics & Gynecology

## 2022-04-23 NOTE — Telephone Encounter (Signed)
Dr Dellis Filbert place order for colposcopy to be schedule. Message left on January 29 and letter mailed on February 6 for patient to call and schedule appointment. Patient has not returned our call.

## 2022-04-27 NOTE — Telephone Encounter (Signed)
Per review of EPIC, patient scheduled for Colpo on 06/19/22 with Dr. Dellis Filbert.   Routing to provider FYI.   Encounter closed.

## 2022-05-08 ENCOUNTER — Other Ambulatory Visit: Payer: Self-pay

## 2022-05-08 MED ORDER — APIXABAN 5 MG PO TABS: 5.0000 mg | ORAL_TABLET | Freq: Two times a day (BID) | ORAL | 11 refills | Status: DC

## 2022-05-08 NOTE — Telephone Encounter (Signed)
Pt called requesting refill on Eliquis. Refill sent to pt's preferred pharmacy per request.

## 2022-05-15 ENCOUNTER — Ambulatory Visit
Admission: RE | Admit: 2022-05-15 | Discharge: 2022-05-15 | Disposition: A | Discharge status: Home / Self Care | Payer: No Typology Code available for payment source | Source: Ambulatory Visit | Attending: Obstetrics & Gynecology | Admitting: Obstetrics & Gynecology

## 2022-05-15 DIAGNOSIS — Z1231 Encounter for screening mammogram for malignant neoplasm of breast: Secondary | ICD-10-CM

## 2022-06-19 ENCOUNTER — Ambulatory Visit (INDEPENDENT_AMBULATORY_CARE_PROVIDER_SITE_OTHER): Payer: No Typology Code available for payment source | Admitting: Obstetrics & Gynecology

## 2022-06-19 ENCOUNTER — Other Ambulatory Visit (HOSPITAL_COMMUNITY)
Admission: RE | Admit: 2022-06-19 | Discharge: 2022-06-19 | Disposition: A | Discharge status: Home / Self Care | Payer: No Typology Code available for payment source | Source: Ambulatory Visit | Attending: Obstetrics & Gynecology | Admitting: Obstetrics & Gynecology

## 2022-06-19 ENCOUNTER — Encounter: Payer: Self-pay | Admitting: Obstetrics & Gynecology

## 2022-06-19 VITALS — BP 114/70 | HR 93

## 2022-06-19 DIAGNOSIS — T8332XA Displacement of intrauterine contraceptive device, initial encounter: Secondary | ICD-10-CM

## 2022-06-19 DIAGNOSIS — R87610 Atypical squamous cells of undetermined significance on cytologic smear of cervix (ASC-US): Secondary | ICD-10-CM | POA: Insufficient documentation

## 2022-06-19 DIAGNOSIS — B3731 Acute candidiasis of vulva and vagina: Secondary | ICD-10-CM

## 2022-06-19 DIAGNOSIS — N760 Acute vaginitis: Secondary | ICD-10-CM | POA: Diagnosis not present

## 2022-06-19 DIAGNOSIS — N898 Other specified noninflammatory disorders of vagina: Secondary | ICD-10-CM

## 2022-06-19 LAB — WET PREP FOR TRICH, YEAST, CLUE

## 2022-06-19 MED ORDER — TINIDAZOLE 500 MG PO TABS: 1000.0000 mg | ORAL_TABLET | Freq: Two times a day (BID) | ORAL | 0 refills | Status: AC

## 2022-06-19 MED ORDER — FLUCONAZOLE 150 MG PO TABS: 150.0000 mg | ORAL_TABLET | ORAL | 0 refills | Status: AC

## 2022-06-19 NOTE — Progress Notes (Signed)
    Wendy Lin 1975/07/19 [id number]        47 y.o.  G2P2L2   RP: PAP: 03-26-22 2nd ascus hpv hr neg for Colpo  HPI: PAP: 03-26-22 2nd ascus hpv hr neg.   OB History  Gravida Para Term Preterm AB Living  2 2 2     2   SAB IAB Ectopic Multiple Live Births          2    # Outcome Date GA Lbr Len/2nd Weight Sex Delivery Anes PTL Lv  2 Term 11/19/10 [redacted]w[redacted]d 04:10 / 00:22 7 lb 15.3 oz (3.609 kg) M Vag-Spont EPI  LIV     Birth Comments: None  1 Term 2007 [redacted]w[redacted]d  7 lb 8 oz (3.402 kg) M Vag-Spont EPI  LIV    Past medical history,surgical history, problem list, medications, allergies, family history and social history were all reviewed and documented in the EPIC chart.   Directed ROS with pertinent positives and negatives documented in the history of present illness/assessment and plan.  Exam:  Vitals:   06/19/22 1134  BP: 114/70  Pulse: 93  SpO2: 99%   General appearance:  Normal  Colposcopy Procedure Note Wendy Lin 06/19/2022  Indications:  Procedure Details  The risks and benefits of the procedure and {verbal:16408} informed consent obtained.  Speculum placed in vagina and excellent visualization of cervix achieved, cervix swabbed x 3 with acetic acid solution.  Findings:  Cervix colposcopy:  Vaginal colposcopy:  Vulvar colposcopy:  Perirectal colposcopy:  The cervix was sprayed with Hurricane before performing the cervical biopsies.  Specimens: Wet prep.  Cervical Bx at 5 O'Clock.  Complications:  . Plan:  Assessment/Plan:  47 y.o. G2P2002   1. ASCUS of cervix with negative high risk HPV - Colposcopy - Surgical pathology( [place]/ [facility])  2. Vaginal discharge - WET PREP FOR TRICH, YEAST, CLUE  3. Intrauterine contraceptive device threads lost, initial encounter - US Transvaginal Non-OB; Future  Other orders - tinidazole (TINDAMAX) 500 MG tablet; Take 2 tablets (1,000 mg total) by mouth 2 (two) times daily for 2 days. -  fluconazole (DIFLUCAN) 150 MG tablet; Take 1 tablet (150 mg total) by mouth every other day for 3 doses.   Wendy Del MD, 11:49 AM 06/19/2022

## 2022-06-20 ENCOUNTER — Encounter: Payer: Self-pay | Admitting: Obstetrics & Gynecology

## 2022-06-22 LAB — SURGICAL PATHOLOGY

## 2022-07-20 ENCOUNTER — Other Ambulatory Visit: Payer: Self-pay | Admitting: *Deleted

## 2022-07-20 DIAGNOSIS — G540 Brachial plexus disorders: Secondary | ICD-10-CM

## 2022-07-27 NOTE — Progress Notes (Unsigned)
HISTORY AND PHYSICAL     CC:  follow up Requesting Provider:  Stevphen Rochester, MD  HPI: Wendy Lin is a 47 y.o. (December 25, 1975) female who was originally presented with of left arm pain and swelling over the a week.  The patient is physically fit and has been exercising with more of a focus on weight training in the past several months.  She noticed pain and swelling develop 4 to 5 days ago.  She initially ascribed this to muscle soreness, but this became progressively worse.  She was evaluated today in the [facility] ER where duplex ultrasound showed acute deep venous thrombosis of the left subclavian and axillary veins.   The patient reports no personal or family history of thrombophilia.  She denies any recent trauma.  She denies pregnancy.  No recent surgeries.  On 08/28/2021, she was taken for LUE and central venogram with intiation of catheter directed thrombolysis.  She was found to have occlusive thrombus throughout the left arm.  She was taken back the next day for repeat left arm venogram and was found to have resolution of her left subclavian and axillary vein thrombosis.  There was re-demonstration of severe stenosis at the thoracic outlet.   She initially did not want to pursue surgical therapy and requested a second opinion.  She was seen by Dr. Thora Lance at [facility] who recommended left thoracic outlet decompression with first rib excision.  Today she returns with multiple questions.  She would like to avoid surgical therapy if at all possible.  I counseled her about the risk of recurrent DVT, even on anticoagulation therapy.  She is understanding and wishes to pursue a nonoperative course at the moment.  07/28/22: Patient returns to clinic for reassurance.  She has noticed some pain about her shoulder she is worrisome to her.  She is scheduled to undergo first rib excision with Dr. Renaye Rakers in September.  We performed a venous duplex for her today.  I reviewed the  results.   Past Medical History:  Diagnosis Date   Abnormal Pap smear    ASCUS, low grade squamous intraepithelial lesion   Abnormal Pap smear of cervix    03-26-22 ascus hpv hr neg   AMA (advanced maternal age) multigravida 35+    Anxiety    Blood transfusion complicating pregnancy 03/09/2005   s/p uterine atony   Low-lying placenta    Postpartum care following vaginal delivery (9/12) 11/20/2010   Seasonal allergies    TOS (thoracic outlet syndrome)    venous, blood clot    Past Surgical History:  Procedure Laterality Date   AV FISTULA PLACEMENT Left 08/28/2021   Procedure: LEFT ARM VENOGRAM INITIATION OF THROMBOLYSIS;  Surgeon: Leonie Douglas, MD;  Location: [facility];  Service: Vascular;  Laterality: Left;   COLPOSCOPY  2017   INTRAUTERINE DEVICE (IUD) INSERTION     mirena inserted 12/17   KNEE SURGERY  1995   LASIK     MOUTH SURGERY  1994   PERIPHERAL VASCULAR THROMBECTOMY N/A 08/29/2021   Procedure: LYSIS RECHECK;  Surgeon: Leonie Douglas, MD;  Location: [facility];  Service: Cardiovascular;  Laterality: N/A;   WISDOM TOOTH EXTRACTION      Social History   Socioeconomic History   Marital status: Single    Spouse name: Not on file   Number of children: Not on file   Years of education: Not on file   Highest education level: Not on file  Occupational History  Not on file  Tobacco Use   Smoking status: Never   Smokeless tobacco: Never  Vaping Use   Vaping Use: Never used  Substance and Sexual Activity   Alcohol use: Yes    Comment: SOCIAL   Drug use: No   Sexual activity: Yes    Partners: Male    Birth control/protection: I.U.D.    Comment: Mirena IUD inserted 12/17  Other Topics Concern   Not on file  Social History Narrative   Not on file   Social Determinants of Health   Financial Resource Strain: Not on file  Food Insecurity: Not on file  Transportation Needs: Not on file  Physical Activity: Not on file  Stress: Not on file  Social  Connections: Not on file  Intimate Partner Violence: Not on file   Family History  Problem Relation Age of Onset   Hypertension Mother    Mitral valve prolapse Mother    Seizures Mother        in pregnancy   Heart disease Mother    Cancer Mother        breast   Parkinson's disease Mother    Dementia Mother        lewi body   Vision loss Father        glacoma   Mitral valve prolapse Sister    Depression Sister    Migraines Sister    Mental illness Sister    Birth defects Maternal Aunt        Breast   Mitral valve prolapse Maternal Uncle    Heart disease Maternal Uncle    Heart disease Maternal Grandmother    COPD Maternal Grandfather    Vision loss Maternal Grandfather        glacoma   Cancer Paternal Grandfather        prostate ca    Current Outpatient Medications  Medication Sig Dispense Refill   apixaban (ELIQUIS) 5 MG TABS tablet Take 1 tablet (5 mg total) by mouth 2 (two) times daily. 60 tablet 11   Azelastine HCl (ASTEPRO NA) Place into the nose as needed.     levonorgestrel (MIRENA) 20 MCG/24HR IUD by Intrauterine route.     Multiple Vitamin (MULTIVITAMIN PO) Take 1 tablet by mouth daily.     tretinoin (RETIN-A) 0.05 % cream Apply 1 Application topically at bedtime.     No current facility-administered medications for this visit.    No Known Allergies  PHYSICAL EXAMINATION: BP 105/68 (BP Location: Right Arm, Patient Position: Sitting, Cuff Size: Normal)   Pulse (!) 54   Temp 98.7 F (37.1 C)   Resp 20   Ht 5' 5.75" (1.67 m)   Wt 148 lb (67.1 kg)   SpO2 100%   BMI 24.07 kg/m   Well appearing woman in no distress Prominent venous collaterals about the left arm persist Mild swelling throughout the left arm   ASSESSMENT/PLAN:: 47 y.o. female with left arm venous thoracic outlet syndrome.  No evidence of new deep venous thrombosis on duplex today.  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  I provided reassurance to her.  I encouraged her to  follow-up with Dr. Renaye Rakers as scheduled to reduce her risk of recurrent DVT.  She can follow-up with me as needed.  Rande Brunt. Lenell Antu, MD [facility] Phone Number: [phone] 07/27/2022 7:25 AM

## 2022-07-28 ENCOUNTER — Ambulatory Visit (INDEPENDENT_AMBULATORY_CARE_PROVIDER_SITE_OTHER): Payer: No Typology Code available for payment source | Admitting: Vascular Surgery

## 2022-07-28 ENCOUNTER — Encounter: Payer: Self-pay | Admitting: Vascular Surgery

## 2022-07-28 ENCOUNTER — Ambulatory Visit (HOSPITAL_COMMUNITY)
Admission: RE | Admit: 2022-07-28 | Discharge: 2022-07-28 | Disposition: A | Discharge status: Home / Self Care | Payer: No Typology Code available for payment source | Source: Ambulatory Visit | Attending: Vascular Surgery | Admitting: Vascular Surgery

## 2022-07-28 VITALS — BP 105/68 | HR 54 | Temp 98.7°F | Resp 20 | Ht 65.75 in | Wt 148.0 lb

## 2022-07-28 DIAGNOSIS — I871 Compression of vein: Secondary | ICD-10-CM

## 2022-07-28 DIAGNOSIS — G540 Brachial plexus disorders: Secondary | ICD-10-CM

## 2022-08-06 ENCOUNTER — Encounter: Payer: Self-pay | Admitting: Obstetrics & Gynecology

## 2022-08-06 ENCOUNTER — Ambulatory Visit (INDEPENDENT_AMBULATORY_CARE_PROVIDER_SITE_OTHER): Payer: No Typology Code available for payment source

## 2022-08-06 ENCOUNTER — Ambulatory Visit (INDEPENDENT_AMBULATORY_CARE_PROVIDER_SITE_OTHER): Payer: No Typology Code available for payment source | Admitting: Obstetrics & Gynecology

## 2022-08-06 ENCOUNTER — Telehealth: Payer: Self-pay | Admitting: *Deleted

## 2022-08-06 VITALS — BP 114/68 | HR 72

## 2022-08-06 DIAGNOSIS — T8332XA Displacement of intrauterine contraceptive device, initial encounter: Secondary | ICD-10-CM

## 2022-08-06 DIAGNOSIS — N83291 Other ovarian cyst, right side: Secondary | ICD-10-CM

## 2022-08-06 DIAGNOSIS — T8332XD Displacement of intrauterine contraceptive device, subsequent encounter: Secondary | ICD-10-CM

## 2022-08-06 NOTE — Telephone Encounter (Signed)
Per review of EPIC, patient scheduled for MRI pelvis w/wo contrast at [facility] on 08/08/22.   Message to business office for benefits.

## 2022-08-06 NOTE — Telephone Encounter (Signed)
-----   Message from Genia Del, MD sent at 08/06/2022  3:19 PM EDT ----- Regarding: Schedule MRI of Pelvis with contrast ASAP Incidental finding of a Complex Rt Ovarian Cyst 2.4 x 1.4 cm.  MRI of Pelvis with contrast. Dr. Seymour Bars

## 2022-08-06 NOTE — Progress Notes (Signed)
    Wendy Lin Sep 18, 1975 [id number]        47 y.o.  G2P2002   RP: Mirena IUD strings lost for Pelvic US  HPI: Patient seen for Colpo ASCUS/HPV HR Neg on 06/19/22 Mild dysplasia (CIN 1).  The IUD strings were not visible at the EO on that exam.  No pelvic pain.  No abnormal bleeding.  No fam h/o Ovarian Cancer.   OB History  Gravida Para Term Preterm AB Living  2 2 2     2   SAB IAB Ectopic Multiple Live Births          2    # Outcome Date GA Lbr Len/2nd Weight Sex Delivery Anes PTL Lv  2 Term 11/19/10 [redacted]w[redacted]d 04:10 / 00:22 7 lb 15.3 oz (3.609 kg) M Vag-Spont EPI  LIV     Birth Comments: None  1 Term 2007 [redacted]w[redacted]d  7 lb 8 oz (3.402 kg) M Vag-Spont EPI  LIV    Past medical history,surgical history, problem list, medications, allergies, family history and social history were all reviewed and documented in the EPIC chart.   Directed ROS with pertinent positives and negatives documented in the history of present illness/assessment and plan.  Exam:  Vitals:   08/06/22 1444  BP: 114/68  Pulse: 72  SpO2: 99%   General appearance:  Normal  Pelvic US today: T/V images.  Retroverted uterus normal in size and shape with no myometrial mass.  The uterus is measured at 7.65 x 4.32 x 3.89 cm.  Thin and symmetrical endometrium measured at 2.6 mm with no mass or thickening seen.  The IUD is seen in proper position within the uterine cavity.  The strings are seen in the cervical canal and ending at 6 mm superior to the external os.  The left ovary is normal in size and appearance with a follicular pattern and normal perfusion.  The right ovary presents a 2.4 x 1.4 cm cyst with internal thickened septation and the blood flow identified within the septation.  Normal perfusion to the right ovary.  No free fluid in the pelvis.   Assessment/Plan:  47 y.o. G2P2002   1. Intrauterine contraceptive device threads lost, subsequent encounter Patient seen for Colpo ASCUS/HPV HR Neg on 06/19/22 Mild  dysplasia (CIN 1).  The IUD strings were not visible at the EO on that exam.  No pelvic pain.  No abnormal bleeding.  No fam h/o Ovarian Cancer. Good IU position of the IUD confirmed by [place].  2. Complex cyst of right ovary Well on Mirena IUD.  IUD in good position per [place] today.  No pelvic pain.  No abnormal bleeding.  No fam h/o Ovarian Cancer. Pelvic US showing a small 2.4 x 1.4 cm complex Rt Ovarian Cyst with a vascularized septum. No FF in the pelvis.  Will further investigate with a Ca 125/CEA today and a Pelvic MRI with contrast.  Management per results. - CA 125 - CEA - MR PELVIS W CONTRAST; Future   Genia Del MD, 2:46 PM 08/06/2022

## 2022-08-06 NOTE — Telephone Encounter (Signed)
Call placed to patient. Advised patient to contact [facility] to schedule, number provided. Advised patient once scheduled our office will pre-cert with insurance,  will provide any additional updates if needed. Advised if any questions regarding out of pocket cost, review with insurance or [facility] when scheduling.   Patient verbalizes understanding and is agreeable.   Order placed by Dr. Seymour Bars  Routing to [facility] Triage.

## 2022-08-07 NOTE — Telephone Encounter (Signed)
MRI Approved. Authorization scanned in EPIC.   Routing to provider for final review.  Will close encounter.

## 2022-08-08 ENCOUNTER — Ambulatory Visit
Admission: RE | Admit: 2022-08-08 | Discharge: 2022-08-08 | Disposition: A | Discharge status: Home / Self Care | Payer: No Typology Code available for payment source | Source: Ambulatory Visit | Attending: Obstetrics & Gynecology | Admitting: Obstetrics & Gynecology

## 2022-08-08 DIAGNOSIS — N83291 Other ovarian cyst, right side: Secondary | ICD-10-CM

## 2022-08-08 MED ORDER — GADOPICLENOL 0.5 MMOL/ML IV SOLN
7.0000 mL | Freq: Once | INTRAVENOUS | Status: AC | PRN
  Administered 2022-08-08: 7 mL via INTRAVENOUS

## 2022-08-11 ENCOUNTER — Encounter: Payer: Self-pay | Admitting: Obstetrics & Gynecology

## 2022-08-11 ENCOUNTER — Ambulatory Visit (INDEPENDENT_AMBULATORY_CARE_PROVIDER_SITE_OTHER): Payer: No Typology Code available for payment source | Admitting: Obstetrics & Gynecology

## 2022-08-11 VITALS — BP 114/70 | HR 75

## 2022-08-11 DIAGNOSIS — Z30431 Encounter for routine checking of intrauterine contraceptive device: Secondary | ICD-10-CM | POA: Diagnosis not present

## 2022-08-11 DIAGNOSIS — N83291 Other ovarian cyst, right side: Secondary | ICD-10-CM | POA: Diagnosis not present

## 2022-08-11 NOTE — Progress Notes (Signed)
    Wendy Lin [birth year] [id number]        47 y.o.  G2P2002   RP: Counseling/management based on Pelvic MRI findings  HPI: Pelvic US done to locate the Mirena IUD on 08/06/22.  The Mirena IUD was in good normal IU position.  An incidental finding of a complex Rt ovarian cyst measured at 2.4 cm with vascular septation was made.  No FF.  No pelvic pain.  Decision was made to further investigate with a pelvic MRI done 08/08/22.   OB History  Gravida Para Term Preterm AB Living  2 2 2     2   SAB IAB Ectopic Multiple Live Births          2    # Outcome Date GA Lbr Len/2nd Weight Sex Delivery Anes PTL Lv  2 Term 11/19/10 105w5d 04:10 / 00:22 7 lb 15.3 oz (3.609 kg) M Vag-Spont EPI  LIV     Birth Comments: None  1 Term 2007 [redacted]w[redacted]d  7 lb 8 oz (3.402 kg) M Vag-Spont EPI  LIV    Past medical history,surgical history, problem list, medications, allergies, family history and social history were all reviewed and documented in the EPIC chart.   Directed ROS with pertinent positives and negatives documented in the history of present illness/assessment and plan.  Exam:  Vitals:   08/11/22 1335  BP: 114/70  Pulse: 75  SpO2: 99%   General appearance:  Normal  Pelvic MRI 08/08/22:   Vascular/Lymphatic: Unremarkable. No pathologically enlarged pelvic lymph nodes identified.  Reproductive: -- Uterus: Measures 7.8 x 3.6 by 4.2 cm (volume = 62 cm^3). Retroflexed. No fibroids or other masses identified. IUD is seen in expected location in the endometrial cavity. Cervix and vagina are unremarkable.   -- Right ovary: Appears normal. No ovarian or adnexal masses identified.   -- Left ovary: Appears normal. No ovarian or adnexal masses identified.   Tiny amount of free fluid in pelvic cul-de-sac most likely physiologic in a reproductive age female. No evidence of peritoneal thickening, enhancement, or nodularity.   Assessment/Plan:  47 y.o. G2P2002   1. Complex cyst of right  ovary Pelvic US done to locate the Mirena IUD on 08/06/22.  The Mirena IUD was in good normal IU position.  An incidental finding of a complex Rt ovarian cyst measured at 2.4 cm with vascular septation was made.  No FF.  No pelvic pain.  Decision was made to further investigate with a pelvic MRI done 08/08/22. Pelvic MRI showing bilateral normal ovaries with a tiny amount of FF in the cul-de-sac.  No pelvic mass.  No nodularity.  Uterus normal with IUD in the IU cavity.  Patient reassured.    2. Encounter for routine checking of intrauterine contraceptive device (IUD)  Mirena IUD in good IU location by pelvic US and MRI.  Genia Del MD, 1:50 PM 08/11/2022

## 2022-08-18 ENCOUNTER — Ambulatory Visit: Payer: No Typology Code available for payment source | Admitting: Obstetrics & Gynecology

## 2022-11-08 HISTORY — PX: FIRST RIB REMOVAL: SHX642

## 2022-12-17 NOTE — Progress Notes (Signed)
GYNECOLOGY  VISIT   HPI: 47 y.o.   Single  Caucasian  female   G2P2002 with No LMP recorded. (Menstrual status: IUD).   here for   6 mo pap.  Last weekend had pain with urination and noted a small amount of blood with wiping.  Took AZO and pain and blood resolved.    Had 2 ASCUS paps with neg HR HPV.  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  States a long history of abnormal paps during her adult life. No prior treatment.   Lost IUD strings.  Had US done confirming the position.  She had a complex right ovarian cyst found with the [place] and it resolved with pelvic MRI.   Had thoracic outlet surgery following a subclavian blood clot.  Off anticoagulation.   [employer].  2 children and 2 step children.  GYNECOLOGIC HISTORY: No LMP recorded. (Menstrual status: IUD). 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 08/28/2021   Postpartum care following vaginal delivery (9/12) 11/20/2010    Past Medical History:  Diagnosis Date   Abnormal Pap smear    ASCUS, low grade squamous intraepithelial lesion   Abnormal Pap smear of cervix    03-26-22 ascus hpv hr neg   AMA (advanced maternal age) multigravida 35+    Anxiety    Blood transfusion complicating pregnancy 03/09/2005   s/p uterine atony   Low-lying placenta    Postpartum care following vaginal delivery (9/12) 11/20/2010   Seasonal allergies    TOS (thoracic outlet syndrome)    venous, blood clot    Past Surgical History:  Procedure Laterality Date   AV FISTULA  PLACEMENT Left 08/28/2021   Procedure: LEFT ARM VENOGRAM INITIATION OF THROMBOLYSIS;  Surgeon: Leonie Douglas, MD;  Location: [facility] OR;  Service: Vascular;  Laterality: Left;   COLPOSCOPY  2017   FIRST RIB REMOVAL  11/2022   INTRAUTERINE DEVICE (IUD) INSERTION     mirena inserted 12/17   KNEE SURGERY  1995   LASIK     MOUTH SURGERY  1994   PERIPHERAL VASCULAR THROMBECTOMY N/A 08/29/2021   Procedure: LYSIS RECHECK;  Surgeon: Leonie Douglas, MD;  Location: [facility] INVASIVE [facility];  Service: Cardiovascular;  Laterality: N/A;   WISDOM TOOTH EXTRACTION      Current Outpatient Medications  Medication Sig Dispense Refill   levonorgestrel (MIRENA) 20 MCG/24HR IUD by Intrauterine route.     Multiple Vitamin (MULTIVITAMIN PO) Take 1 tablet by mouth daily.     tretinoin (RETIN-A) 0.05 % cream Apply 1 Application topically at bedtime.     Azelastine HCl (ASTEPRO NA) Place into the nose as needed. (Patient not taking: Reported on 12/31/2022)  No current facility-administered medications for this visit.     ALLERGIES: Patient has no known allergies.  Family History  Problem Relation Age of Onset   Hypertension Mother    Mitral valve prolapse Mother    Seizures Mother        in pregnancy   Heart disease Mother    Cancer Mother        breast   Parkinson's disease Mother    Dementia Mother        lewi body   Vision loss Father        glacoma   Mitral valve prolapse Sister    Depression Sister    Migraines Sister    Mental illness Sister    Birth defects Maternal Aunt        Breast   Mitral valve prolapse Maternal Uncle    Heart disease Maternal Uncle    Heart disease Maternal Grandmother    COPD Maternal Grandfather    Vision loss Maternal Grandfather        glacoma   Cancer Paternal Grandfather        prostate ca    Social History   Socioeconomic History   Marital status: Single    Spouse name: Not on file   Number of children: Not on file   Years of education: Not on  file   Highest education level: Not on file  Occupational History   Not on file  Tobacco Use   Smoking status: Never   Smokeless tobacco: Never  Vaping Use   Vaping status: Never Used  Substance and Sexual Activity   Alcohol use: Yes    Comment: SOCIAL   Drug use: No   Sexual activity: Yes    Partners: Male    Birth control/protection: I.U.D.    Comment: Mirena IUD inserted 12/17  Other Topics Concern   Not on file  Social History Narrative   Not on file   Social Determinants of Health   Financial Resource Strain: Low Risk  (04/03/2022)   Received from [facility], [facility]   Overall Financial Resource Strain (CARDIA)    Difficulty of Paying Living Expenses: Not hard at all  Food Insecurity: No Food Insecurity (04/03/2022)   Received from [facility], [facility]   Hunger Vital Sign    Worried About Running Out of Food in the Last Year: Never true    Ran Out of Food in the Last Year: Never true  Transportation Needs: No Transportation Needs (04/03/2022)   Received from [facility], [facility]   PRAPARE - Transportation    Lack of Transportation (Medical): No    Lack of Transportation (Non-Medical): No  Physical Activity: Sufficiently Active (04/03/2022)   Received from [facility], [facility]   Exercise Vital Sign    Days of Exercise per Week: 5 days    Minutes of Exercise per Session: 60 min  Stress: No Stress Concern Present (02/27/2021)   Received from [facility], [facility] - Occupational Stress Questionnaire    Feeling of Stress : Not at all  Social Connections: Socially Integrated (04/03/2022)   Received from [facility], [facility]   Social Network    How would you rate your social network (family, work, friends)?: Good participation with social networks  Intimate Partner Violence: Not At Risk (04/03/2022)   Received from [facility], [facility]   HITS    Over the last 12 months how  often  did your partner physically hurt you?: 1    Over the last 12 months how often did your partner insult you or talk down to you?: 1    Over the last 12 months how often did your partner threaten you with physical harm?: 1    Over the last 12 months how often did your partner scream or curse at you?: 1    Review of Systems  Genitourinary:  Positive for dysuria and hematuria.    PHYSICAL EXAMINATION:    BP 118/72 (BP Location: Right Arm, Patient Position: Sitting, Cuff Size: Normal)   Pulse 67   Ht 5\' 6"  (1.676 m)   Wt 147 lb (66.7 kg)   SpO2 99%   BMI 23.73 kg/m     General appearance: alert, cooperative and appears stated age   Pelvic: External genitalia:  no lesions              Urethra:  normal appearing urethra with no masses, tenderness or lesions              Bartholins and Skenes: normal                 Vagina: normal appearing vagina with normal color and discharge, no lesions              Cervix: no lesions.  IUD strings not seen.                 Bimanual Exam:  Uterus:  normal size, contour, position, consistency, mobility, non-tender              Adnexa: no mass, fullness, tenderness              Chaperone was present for exam:  Warren Lacy, CMA  ASSESSMENT  Hx ASCUS paps, neg HR HPV.  Colposcopic bx showing atypia and LGSIL.  Hx thoracic outlet syndrome and had a subclavian blood clot.  Status post surgical correction.  Mirena IUD.  IUD position confirmed by [place] and MRI. Hx right ovarian cyst resolved.  Hematuria.   PLAN  Pap and HR HPV collected.  Final plan to follow.  Pelvic US images and pelvic MRI report reviewed.  We discussed ovarian cysts. Urinalysis:  sg 1.015, ph 7.0, negative for everything.  Will need annual exam in about 6 months.   30 min  total time was spent for this patient encounter, including preparation, face-to-face counseling with the patient, coordination of care, and documentation of the encounter.

## 2022-12-31 ENCOUNTER — Ambulatory Visit (INDEPENDENT_AMBULATORY_CARE_PROVIDER_SITE_OTHER): Payer: No Typology Code available for payment source | Admitting: Obstetrics and Gynecology

## 2022-12-31 ENCOUNTER — Other Ambulatory Visit (HOSPITAL_COMMUNITY)
Admission: RE | Admit: 2022-12-31 | Discharge: 2022-12-31 | Disposition: A | Discharge status: Home / Self Care | Payer: No Typology Code available for payment source | Source: Ambulatory Visit | Attending: Obstetrics and Gynecology | Admitting: Obstetrics and Gynecology

## 2022-12-31 ENCOUNTER — Encounter: Payer: Self-pay | Admitting: Obstetrics and Gynecology

## 2022-12-31 VITALS — BP 118/72 | HR 67 | Ht 66.0 in | Wt 147.0 lb

## 2022-12-31 DIAGNOSIS — N879 Dysplasia of cervix uteri, unspecified: Secondary | ICD-10-CM | POA: Diagnosis present

## 2022-12-31 DIAGNOSIS — R300 Dysuria: Secondary | ICD-10-CM | POA: Diagnosis not present

## 2022-12-31 DIAGNOSIS — R319 Hematuria, unspecified: Secondary | ICD-10-CM | POA: Diagnosis not present

## 2022-12-31 DIAGNOSIS — Z8742 Personal history of other diseases of the female genital tract: Secondary | ICD-10-CM | POA: Diagnosis not present

## 2022-12-31 LAB — URINALYSIS, COMPLETE W/RFL CULTURE
Bacteria, UA: NONE SEEN /[HPF]
Bilirubin Urine: NEGATIVE
Glucose, UA: NEGATIVE
Hgb urine dipstick: NEGATIVE
Hyaline Cast: NONE SEEN /[LPF]
Ketones, ur: NEGATIVE
Leukocyte Esterase: NEGATIVE
Nitrites, Initial: NEGATIVE
Protein, ur: NEGATIVE
RBC / HPF: NONE SEEN /[HPF] (ref 0–2)
Specific Gravity, Urine: 1.015 (ref 1.001–1.035)
WBC, UA: NONE SEEN /[HPF] (ref 0–5)
pH: 7 (ref 5.0–8.0)

## 2022-12-31 LAB — NO CULTURE INDICATED

## 2023-01-04 LAB — CYTOLOGY - PAP
Comment: NEGATIVE
Diagnosis: UNDETERMINED — AB
High risk HPV: NEGATIVE

## 2023-03-03 ENCOUNTER — Other Ambulatory Visit: Payer: Self-pay | Admitting: Medical Genetics

## 2023-03-29 ENCOUNTER — Other Ambulatory Visit (HOSPITAL_COMMUNITY): Payer: Self-pay

## 2023-04-15 ENCOUNTER — Other Ambulatory Visit (HOSPITAL_COMMUNITY)
Admission: RE | Admit: 2023-04-15 | Discharge: 2023-04-15 | Disposition: A | Discharge status: Home / Self Care | Payer: Self-pay | Source: Ambulatory Visit | Attending: Medical Genetics | Admitting: Medical Genetics

## 2023-04-21 ENCOUNTER — Other Ambulatory Visit: Payer: Self-pay | Admitting: Obstetrics and Gynecology

## 2023-04-21 DIAGNOSIS — Z1231 Encounter for screening mammogram for malignant neoplasm of breast: Secondary | ICD-10-CM

## 2023-04-27 LAB — GENECONNECT MOLECULAR SCREEN: Genetic Analysis Overall Interpretation: NEGATIVE

## 2023-05-20 ENCOUNTER — Ambulatory Visit
Admission: RE | Admit: 2023-05-20 | Discharge: 2023-05-20 | Disposition: A | Discharge status: Home / Self Care | Payer: No Typology Code available for payment source | Source: Ambulatory Visit

## 2023-05-20 DIAGNOSIS — Z1231 Encounter for screening mammogram for malignant neoplasm of breast: Secondary | ICD-10-CM

## 2023-05-24 ENCOUNTER — Encounter: Payer: Self-pay | Admitting: Obstetrics and Gynecology

## 2023-07-05 LAB — COLOGUARD: COLOGUARD: NEGATIVE

## 2023-07-05 LAB — EXTERNAL GENERIC LAB PROCEDURE: COLOGUARD: NEGATIVE

## 2023-07-07 ENCOUNTER — Ambulatory Visit: Payer: No Typology Code available for payment source | Admitting: Obstetrics and Gynecology

## 2023-07-13 ENCOUNTER — Other Ambulatory Visit (HOSPITAL_COMMUNITY)
Admission: RE | Admit: 2023-07-13 | Discharge: 2023-07-13 | Disposition: A | Discharge status: Home / Self Care | Source: Ambulatory Visit | Attending: Obstetrics and Gynecology | Admitting: Obstetrics and Gynecology

## 2023-07-13 ENCOUNTER — Encounter: Payer: Self-pay | Admitting: Obstetrics and Gynecology

## 2023-07-13 ENCOUNTER — Ambulatory Visit (INDEPENDENT_AMBULATORY_CARE_PROVIDER_SITE_OTHER): Admitting: Obstetrics and Gynecology

## 2023-07-13 VITALS — BP 114/76 | HR 94 | Ht 66.0 in | Wt 147.0 lb

## 2023-07-13 DIAGNOSIS — Z124 Encounter for screening for malignant neoplasm of cervix: Secondary | ICD-10-CM | POA: Diagnosis not present

## 2023-07-13 DIAGNOSIS — N870 Mild cervical dysplasia: Secondary | ICD-10-CM | POA: Insufficient documentation

## 2023-07-13 NOTE — Progress Notes (Signed)
 GYNECOLOGY  VISIT   HPI: 48 y.o.   Single  Caucasian female   G2P2002 with No LMP recorded. (Menstrual status: IUD).   here for: 6 month pap.  Last pap 12/31/22:  ASCUS, neg HR HPV.   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  Colposcopy prompted by two prior paps showing ASCUS, negative HR HPV.   Had correction of her thoracic outlet in September, 2024.  Dr. Burnell Carry. Freischlad is her vascular surgeon.  GYNECOLOGIC HISTORY: No LMP recorded. (Menstrual status: IUD). 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 08/28/2021   Postpartum care following vaginal delivery (9/12) 11/20/2010    Past Medical History:  Diagnosis Date   Abnormal Pap smear    ASCUS, low grade squamous intraepithelial lesion   Abnormal Pap smear of cervix    03-26-22 ascus hpv hr neg   AMA (advanced maternal age) multigravida 35+    Anxiety    Blood transfusion complicating pregnancy 03/09/2005   s/p uterine atony   Low-lying placenta    Postpartum care following vaginal delivery (9/12) 11/20/2010   Seasonal allergies    TOS (thoracic outlet syndrome)    venous, blood clot    Past Surgical History:  Procedure Laterality Date   AV FISTULA PLACEMENT Left 08/28/2021   Procedure: LEFT ARM VENOGRAM INITIATION OF THROMBOLYSIS;  Surgeon: Carlene Che, MD;  Location: [facility] OR;  Service: Vascular;  Laterality: Left;   COLPOSCOPY  2017   FIRST RIB REMOVAL  11/2022   INTRAUTERINE DEVICE (IUD) INSERTION      mirena inserted 12/17   KNEE SURGERY  1995   LASIK     MOUTH SURGERY  1994   PERIPHERAL VASCULAR THROMBECTOMY N/A 08/29/2021   Procedure: LYSIS RECHECK;  Surgeon: Carlene Che, MD;  Location: [facility] INVASIVE CV LAB;  Service: Cardiovascular;  Laterality: N/A;   WISDOM TOOTH EXTRACTION      Current Outpatient Medications  Medication Sig Dispense Refill   Azelastine HCl (ASTEPRO NA) Place into the nose as needed.     levonorgestrel (MIRENA) 20 MCG/24HR IUD by Intrauterine route.     Multiple Vitamin (MULTIVITAMIN PO) Take 1 tablet by mouth daily.     tretinoin (RETIN-A) 0.05 % cream Apply 1 Application topically at bedtime.     UNABLE TO FIND Med Name: Semaglutide/Pyridoxine 2055mcg-50mg /mL INJ. . Inject 0.25mg  (12.5 units) subcutaneously once weekly for 4 weeks COMPOUNDED BY PHARMACY, ALLOW 2-3 BUSINESS DAYS TO FILL do not use after [BUD] on vial     No current facility-administered medications for this visit.     ALLERGIES: Patient has no known allergies.  Family History  Problem Relation Age  of Onset   Hypertension Mother    Mitral valve prolapse Mother    Seizures Mother        in pregnancy   Heart disease Mother    Cancer Mother        breast   Parkinson's disease Mother    Dementia Mother        lewi body   Vision loss Father        glacoma   Mitral valve prolapse Sister    Depression Sister    Migraines Sister    Mental illness Sister    Birth defects Maternal Aunt        Breast   Mitral valve prolapse Maternal Uncle    Heart disease Maternal Uncle    Heart disease Maternal Grandmother    COPD Maternal Grandfather    Vision loss Maternal Grandfather        glacoma   Cancer Paternal Grandfather        prostate ca    Social History   Socioeconomic History   Marital status: Single    Spouse name: Not on file   Number of children: Not on file   Years of education: Not on file   Highest education level: Not on file  Occupational History   Not on file   Tobacco Use   Smoking status: Never   Smokeless tobacco: Never  Vaping Use   Vaping status: Never Used  Substance and Sexual Activity   Alcohol use: Yes    Comment: SOCIAL   Drug use: No   Sexual activity: Yes    Partners: Male    Birth control/protection: I.U.D.    Comment: Mirena IUD inserted 12/17  Other Topics Concern   Not on file  Social History Narrative   Not on file   Social Drivers of Health   Financial Resource Strain: Low Risk  (06/08/2023)   Received from [facility]   Overall Financial Resource Strain (CARDIA)    Difficulty of Paying Living Expenses: Not hard at all  Food Insecurity: No Food Insecurity (06/08/2023)   Received from [facility]   Hunger Vital Sign    Worried About Running Out of Food in the Last Year: Never true    Ran Out of Food in the Last Year: Never true  Transportation Needs: No Transportation Needs (06/08/2023)   Received from [facility] - Transportation    Lack of Transportation (Medical): No    Lack of Transportation (Non-Medical): No  Physical Activity: Sufficiently Active (06/08/2023)   Received from [facility]   Exercise Vital Sign    Days of Exercise per Week: 5 days    Minutes of Exercise per Session: 60 min  Stress: No Stress Concern Present (06/08/2023)   Received from [facility] - Occupational Stress Questionnaire    Feeling of Stress : Not at all  Social Connections: Socially Integrated (06/08/2023)   Received from [facility]   Social Network    How would you rate your social network (family, work, friends)?: Good participation with social networks  Intimate Partner Violence: Not At Risk (06/08/2023)   Received from [facility]   HITS    Over the last 12 months how often did your partner physically hurt you?: Never    Over the last 12 months how often did your partner insult you or talk down to you?: Never    Over the last 12 months how often did  your partner  threaten you with physical harm?: Never    Over the last 12 months how often did your partner scream or curse at you?: Never    Review of Systems  All other systems reviewed and are negative.   PHYSICAL EXAMINATION:   BP 114/76 (BP Location: Left Arm, Patient Position: Sitting, Cuff Size: Normal)   Pulse 94   Ht 5\' 6"  (1.676 m)   Wt 147 lb (66.7 kg)   SpO2 99%   BMI 23.73 kg/m     General appearance: alert, cooperative and appears stated age   Pelvic: External genitalia:  no lesions              Urethra:  normal appearing urethra with no masses, tenderness or lesions              Bartholins and Skenes: normal                 Vagina: normal appearing vagina with normal color and discharge, no lesions              Cervix: no lesions.  IUD strings seen.                 Bimanual Exam:  Uterus:  normal size, contour, position, consistency, mobility, non-tender              Adnexa: no mass, fullness, tenderness          Chaperone was present for exam:   Cottie Diss, CMA  ASSESSMENT:  Hx ASCUS paps, neg HR HPV.  Colposcopic bx showing atypia and LGSIL.  Hx thoracic outlet syndrome and had a subclavian blood clot.  Status post surgical correction.  Mirena IUD.  IUD position confirmed by US  and MRI.  Threads seen today.   PLAN:  Pap and HR HPV collected. Annual exam in 6 months.  We discussed that her surgeon would need to ok the use of any estrogens if this is considered for the future.   24 min  total time was spent for this patient encounter, including preparation, face-to-face counseling with the patient, coordination of care, and documentation of the encounter.

## 2023-07-16 ENCOUNTER — Encounter: Payer: Self-pay | Admitting: Obstetrics and Gynecology

## 2023-07-16 LAB — CYTOLOGY - PAP
Comment: NEGATIVE
Diagnosis: UNDETERMINED — AB
High risk HPV: NEGATIVE

## 2023-11-18 ENCOUNTER — Telehealth: Payer: Self-pay

## 2023-11-18 NOTE — Telephone Encounter (Signed)
 Please have patient keep her annual exam appointment for December.  We can do her pap at the same time.   I think she is overdue for her annual exam, as she has had other visits for problems and procedures.

## 2023-11-18 NOTE — Telephone Encounter (Signed)
 Patient is in pap recall. She had a 6mth pap on 07-13-23. On her results it said she needs pap recall for . Patient is scheduled for an aex 02-23-24. Please advise if aex needs to be pushed back, if pap recall needs to change or keep appt & pap recall as it is. Routing to Dr Nikki.

## 2023-11-19 NOTE — Telephone Encounter (Signed)
 Encounter reviewed and closed.

## 2024-02-23 ENCOUNTER — Other Ambulatory Visit (HOSPITAL_COMMUNITY)
Admission: RE | Admit: 2024-02-23 | Discharge: 2024-02-23 | Disposition: A | Discharge status: Home / Self Care | Source: Ambulatory Visit | Attending: Obstetrics and Gynecology | Admitting: Obstetrics and Gynecology

## 2024-02-23 ENCOUNTER — Ambulatory Visit: Admitting: Obstetrics and Gynecology

## 2024-02-23 ENCOUNTER — Encounter: Payer: Self-pay | Admitting: Obstetrics and Gynecology

## 2024-02-23 VITALS — BP 98/64 | HR 79 | Wt 134.0 lb

## 2024-02-23 DIAGNOSIS — Z124 Encounter for screening for malignant neoplasm of cervix: Secondary | ICD-10-CM | POA: Diagnosis present

## 2024-02-23 DIAGNOSIS — Z87410 Personal history of cervical dysplasia: Secondary | ICD-10-CM | POA: Diagnosis not present

## 2024-02-23 DIAGNOSIS — Z01419 Encounter for gynecological examination (general) (routine) without abnormal findings: Secondary | ICD-10-CM | POA: Diagnosis not present

## 2024-02-23 DIAGNOSIS — N912 Amenorrhea, unspecified: Secondary | ICD-10-CM

## 2024-02-23 DIAGNOSIS — Z1331 Encounter for screening for depression: Secondary | ICD-10-CM

## 2024-02-23 NOTE — Progress Notes (Unsigned)
 48 y.o. G48P2002 Single Caucasian female here for annual exam.     Followed for abnormal pap.    Using IUD to control her periods.   No current periods concerns.  No bleeding.   PCP: Gladystine Erminio CROME, MD   No LMP recorded. (Menstrual status: IUD).           Sexually active: Yes.    The current method of family planning is IUD Mirena IUD inserted 12/17.    Vasectomy.   Menopausal hormone therapy:  n/a Exercising: Yes.    Gym/ health club routine includes strength training. Smoker:  no  OB History  Gravida Para Term Preterm AB Living  2 2 2   2   SAB IAB Ectopic Multiple Live Births      2    # Outcome Date GA Lbr Len/2nd Weight Sex Type Anes PTL Lv  2 Term 11/19/10 [redacted]w[redacted]d 04:10 / 00:22 7 lb 15.3 oz (3.609 kg) M Vag-Spont EPI  LIV     Birth Comments: None  1 Term 2007 [redacted]w[redacted]d  7 lb 8 oz (3.402 kg) M Vag-Spont EPI  LIV     HEALTH MAINTENANCE: Last 2 paps:  07/13/23 ASCUS, HR HPV neg, 12/31/22 ASCUS, HR HPV neg  History of abnormal Pap or positive HPV:  yes Mammogram:   05/20/23 Breast Density Cat D, BIRADS Cat 1 neg  Colonoscopy:  Cologuard - 06/29/23 neg  Bone Density:  n/a  Result  n/a   Immunization History  Administered Date(s) Administered   Influenza,inj,Quad PF,6+ Mos 12/14/2017, 03/26/2022   PFIZER(Purple Top)SARS-COV-2 Vaccination 05/25/2019, 06/19/2019, 03/13/2020   Pfizer(Comirnaty)Fall Seasonal Vaccine 12 years and older 02/08/2023   Tdap 11/21/2010      reports that she has never smoked. She has never used smokeless tobacco. She reports current alcohol use. She reports that she does not use drugs.  Past Medical History:  Diagnosis Date   Abnormal Pap smear    ASCUS, low grade squamous intraepithelial lesion   Abnormal Pap smear of cervix    03-26-22 ascus hpv hr neg   AMA (advanced maternal age) multigravida 35+    Anxiety    Blood transfusion complicating pregnancy 03/09/2005   s/p uterine atony   Low-lying placenta    Postpartum care following  vaginal delivery (9/12) 11/20/2010   Seasonal allergies    TOS (thoracic outlet syndrome)    venous, blood clot    Past Surgical History:  Procedure Laterality Date   AV FISTULA PLACEMENT Left 08/28/2021   Procedure: LEFT ARM VENOGRAM INITIATION OF THROMBOLYSIS;  Surgeon: Magda Debby SAILOR, MD;  Location: [facility] OR;  Service: Vascular;  Laterality: Left;   COLPOSCOPY  2017   FIRST RIB REMOVAL  11/2022   INTRAUTERINE DEVICE (IUD) INSERTION     mirena inserted 12/17   KNEE SURGERY  1995   LASIK     MOUTH SURGERY  1994   PERIPHERAL VASCULAR THROMBECTOMY N/A 08/29/2021   Procedure: LYSIS RECHECK;  Surgeon: Magda Debby SAILOR, MD;  Location: [facility];  Service: Cardiovascular;  Laterality: N/A;   WISDOM TOOTH EXTRACTION      Current Outpatient Medications  Medication Sig Dispense Refill   Azelastine HCl (ASTEPRO NA) Place into the nose as needed.     levonorgestrel (MIRENA) 20 MCG/24HR IUD by Intrauterine route.     Multiple Vitamin (MULTIVITAMIN PO) Take 1 tablet by mouth daily.     tretinoin (RETIN-A) 0.05 % cream Apply 1 Application topically at bedtime.  UNABLE TO FIND Med Name: Semaglutide/Pyridoxine 2068mcg-50mg /mL INJ. . Inject 0.25mg  (12.5 units) subcutaneously once weekly for 4 weeks COMPOUNDED BY PHARMACY, ALLOW 2-3 BUSINESS DAYS TO FILL do not use after [BUD] on vial     No current facility-administered medications for this visit.    ALLERGIES: Patient has no known allergies.  Family History  Problem Relation Age of Onset   Hypertension Mother    Mitral valve prolapse Mother    Seizures Mother        in pregnancy   Heart disease Mother    Cancer Mother        breast   Parkinson's disease Mother    Dementia Mother        lewi body   Vision loss Father        glacoma   Mitral valve prolapse Sister    Depression Sister    Migraines Sister    Mental illness Sister    Birth defects Maternal Aunt        Breast   Mitral valve prolapse Maternal Uncle     Heart disease Maternal Uncle    Heart disease Maternal Grandmother    COPD Maternal Grandfather    Vision loss Maternal Grandfather        glacoma   Cancer Paternal Grandfather        prostate ca    Review of Systems  PHYSICAL EXAM:  BP 98/64 (BP Location: Right Arm, Patient Position: Sitting, Cuff Size: Normal)   Pulse 79   Wt 134 lb (60.8 kg)   SpO2 98%   BMI 21.63 kg/m     General appearance: alert, cooperative and appears stated age Head: normocephalic, without obvious abnormality, atraumatic Neck: no adenopathy, supple, symmetrical, trachea midline and thyroid normal to inspection and palpation Lungs: clear to auscultation bilaterally Breasts: normal appearance, no masses or tenderness, No nipple retraction or dimpling, No nipple discharge or bleeding, No axillary adenopathy.  Incision in left axillary region.  Heart: regular rate and rhythm Abdomen: soft, non-tender; no masses, no organomegaly Extremities: extremities normal, atraumatic, no cyanosis or edema Skin: skin color, texture, turgor normal. No rashes or lesions Lymph nodes: cervical, supraclavicular, and axillary nodes normal. Neurologic: grossly normal  Pelvic: External genitalia:  no lesions              No abnormal inguinal nodes palpated.              Urethra:  normal appearing urethra with no masses, tenderness or lesions              Bartholins and Skenes: normal                 Vagina: normal appearing vagina with normal color and discharge, no lesions              Cervix: no lesions.  Threads not seen.              Pap taken: yes Bimanual Exam:  Uterus:  normal size, contour, position, consistency, mobility, non-tender              Adnexa: no mass, fullness, tenderness              Rectal exam: Yes.  Confirms.              Anus:  normal sphincter tone, no lesions  Chaperone was present for exam:   Darice   ASSESSMENT: Well woman visit with gynecologic exam. Hx ASCUS paps, neg HR  HPV.  Colposcopic  bx showing atypia and LGSIL.  Hx thoracic outlet syndrome and had a subclavian blood clot.  Status post surgical correction.  Mirena IUD.  IUD position confirmed by US  and MRI.  ***Threads not seen today.  PHQ-2-9: 0  ***  PLAN: Mammogram screening discussed. Self breast awareness reviewed. Pap and HRV collected:  {yes no:[id number]} Guidelines for Calcium, Vitamin D, regular exercise program including cardiovascular and weight bearing exercise. Medication refills:  *** FSH, estradiol.  Patient may return to have this drawn.  Follow up:  ***    Additional counseling given.  {yes [id number]. ***  total time was spent for this patient encounter, including preparation, face-to-face counseling with the patient, coordination of care, and documentation of the encounter in addition to doing the well woman visit with gynecologic exam.

## 2024-02-23 NOTE — Patient Instructions (Signed)

## 2024-02-28 ENCOUNTER — Ambulatory Visit: Payer: Self-pay | Admitting: Obstetrics and Gynecology

## 2024-02-28 LAB — CYTOLOGY - PAP
Comment: NEGATIVE
Diagnosis: UNDETERMINED — AB
High risk HPV: POSITIVE — AB

## 2024-02-29 ENCOUNTER — Other Ambulatory Visit

## 2024-02-29 ENCOUNTER — Other Ambulatory Visit: Payer: Self-pay | Admitting: Obstetrics and Gynecology

## 2024-02-29 ENCOUNTER — Other Ambulatory Visit: Payer: Self-pay

## 2024-02-29 DIAGNOSIS — N912 Amenorrhea, unspecified: Secondary | ICD-10-CM

## 2024-02-29 DIAGNOSIS — R87610 Atypical squamous cells of undetermined significance on cytologic smear of cervix (ASC-US): Secondary | ICD-10-CM

## 2024-02-29 MED ORDER — METRONIDAZOLE 500 MG PO TABS: 500.0000 mg | ORAL_TABLET | Freq: Two times a day (BID) | ORAL | 0 refills | Status: DC

## 2024-02-29 NOTE — Telephone Encounter (Signed)
 Colpo is 04-05-24

## 2024-03-01 LAB — FOLLICLE STIMULATING HORMONE: FSH: 14.6 m[IU]/mL

## 2024-03-08 LAB — ESTRADIOL, ULTRA SENS: Estradiol, Ultra Sensitive: 385 pg/mL

## 2024-03-10 NOTE — Telephone Encounter (Signed)
"  Please advise pt message   "

## 2024-03-14 NOTE — Telephone Encounter (Signed)
 Dr. Nikki -patient is schedule for colpo on 1/28. Do you have a preference for consult prior to this appt or after?

## 2024-03-22 ENCOUNTER — Ambulatory Visit: Admitting: Obstetrics and Gynecology

## 2024-03-22 ENCOUNTER — Encounter: Payer: Self-pay | Admitting: Obstetrics and Gynecology

## 2024-03-22 VITALS — BP 116/74 | HR 81

## 2024-03-22 DIAGNOSIS — R87810 Cervical high risk human papillomavirus (HPV) DNA test positive: Secondary | ICD-10-CM | POA: Diagnosis not present

## 2024-03-22 DIAGNOSIS — R87610 Atypical squamous cells of undetermined significance on cytologic smear of cervix (ASC-US): Secondary | ICD-10-CM

## 2024-03-22 DIAGNOSIS — R61 Generalized hyperhidrosis: Secondary | ICD-10-CM

## 2024-03-22 DIAGNOSIS — Z975 Presence of (intrauterine) contraceptive device: Secondary | ICD-10-CM

## 2024-03-22 NOTE — Progress Notes (Signed)
 "  GYNECOLOGY  VISIT   HPI: 49 y.o.   Single  Caucasian female   G2P2002 with No LMP recorded. (Menstrual status: IUD).   here for: HRT Consult.  Patient has questions about the changes of her FSH and estradiol  levels.   02/29/24 - FSH 14.6, estradiol  385 03/26/22 - FSH 2.2  Some night sweats 50% of the time.  Some vaginal dryness. Not feeling as rested in the am.   No lifestyle changes or new events.      Took Flagyl  for suggested BV on the recent pap.   She had GI upset.   Asking about her pap and upcoming colposcopy.  Pap ASCUS, HR HPV positive.   Has had colposcopy in the past.   Mirena IUD strings are short and not always visible on exam.  It was placed 02/2026.  Patient finds it very beneficial to control her periods.   Husband has had vasectomy.    GYNECOLOGIC HISTORY: No LMP recorded. (Menstrual status: IUD). 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 08/28/2021   Postpartum care following vaginal delivery (9/12) 11/20/2010    Past Medical History:  Diagnosis Date   Abnormal Pap smear    ASCUS, low grade squamous intraepithelial lesion   Abnormal Pap smear of cervix    03-26-22 ascus hpv hr neg   AMA (advanced maternal age) multigravida 35+    Anxiety    Blood transfusion complicating pregnancy 03/09/2005   s/p uterine atony   Low-lying placenta    Postpartum care following vaginal delivery (9/12) 11/20/2010   Seasonal allergies    TOS (thoracic outlet  syndrome)    venous, blood clot    Past Surgical History:  Procedure Laterality Date   AV FISTULA PLACEMENT Left 08/28/2021   Procedure: LEFT ARM VENOGRAM INITIATION OF THROMBOLYSIS;  Surgeon: Magda Debby SAILOR, MD;  Location: [facility] OR;  Service: Vascular;  Laterality: Left;   COLPOSCOPY  2017   FIRST RIB REMOVAL  11/2022   INTRAUTERINE DEVICE (IUD) INSERTION     mirena inserted 12/17   KNEE SURGERY  1995   LASIK     MOUTH SURGERY  1994   PERIPHERAL VASCULAR THROMBECTOMY N/A 08/29/2021   Procedure: LYSIS RECHECK;  Surgeon: Magda Debby SAILOR, MD;  Location: [facility];  Service: Cardiovascular;  Laterality: N/A;   WISDOM TOOTH EXTRACTION      Current Outpatient Medications  Medication Sig Dispense Refill   Azelastine HCl (ASTEPRO NA) Place into the nose as needed.     levonorgestrel (MIRENA) 20 MCG/24HR IUD by Intrauterine route.     Multiple Vitamin (MULTIVITAMIN PO) Take 1 tablet by  mouth daily.     tretinoin (RETIN-A) 0.05 % cream Apply 1 Application topically at bedtime.     UNABLE TO FIND Med Name: Semaglutide/Pyridoxine 2055mcg-50mg /mL INJ. . Inject 0.25mg  (12.5 units) subcutaneously once weekly for 4 weeks COMPOUNDED BY PHARMACY, ALLOW 2-3 BUSINESS DAYS TO FILL do not use after [BUD] on vial     No current facility-administered medications for this visit.     ALLERGIES: Patient has no known allergies.  Family History  Problem Relation Age of Onset   Hypertension Mother    Mitral valve prolapse Mother    Seizures Mother        in pregnancy   Heart disease Mother    Cancer Mother        breast   Parkinson's disease Mother    Dementia Mother        lewi body   Vision loss Father        glacoma   Mitral valve prolapse Sister    Depression Sister    Migraines Sister    Mental illness Sister    Birth defects Maternal Aunt        Breast   Mitral valve prolapse Maternal Uncle    Heart disease Maternal Uncle    Heart disease Maternal Grandmother    COPD  Maternal Grandfather    Vision loss Maternal Grandfather        glacoma   Cancer Paternal Grandfather        prostate ca    Social History   Socioeconomic History   Marital status: Single    Spouse name: Not on file   Number of children: Not on file   Years of education: Not on file   Highest education level: Not on file  Occupational History   Not on file  Tobacco Use   Smoking status: Never   Smokeless tobacco: Never  Vaping Use   Vaping status: Never Used  Substance and Sexual Activity   Alcohol use: Yes    Comment: SOCIAL   Drug use: No   Sexual activity: Yes    Partners: Male    Birth control/protection: I.U.D.    Comment: Mirena IUD inserted 12/17  Other Topics Concern   Not on file  Social History Narrative   Not on file   Social Drivers of Health   Tobacco Use: Low Risk (03/22/2024)   Patient History    Smoking Tobacco Use: Never    Smokeless Tobacco Use: Never    Passive Exposure: Not on file  Financial Resource Strain: Low Risk (06/08/2023)   Received from [facility]   Overall Financial Resource Strain (CARDIA)    Difficulty of Paying Living Expenses: Not hard at all  Food Insecurity: No Food Insecurity (06/08/2023)   Received from [facility]   Epic    Within the past 12 months, you worried that your food would run out before you got the money to buy more.: Never true    Within the past 12 months, the food you bought just didn't last and you didn't have money to get more.: Never true  Transportation Needs: No Transportation Needs (06/08/2023)   Received from [facility] - Transportation    Lack of Transportation (Medical): No    Lack of Transportation (Non-Medical): No  Physical Activity: Sufficiently Active (06/08/2023)   Received from [facility]   Exercise Vital Sign    On average, how many days per week do you engage in moderate to strenuous exercise (  like a brisk walk)?: 5 days    On average, how many minutes do you engage in  exercise at this level?: 60 min  Stress: No Stress Concern Present (06/08/2023)   Received from [facility] - Occupational Stress Questionnaire    Feeling of Stress : Not at all  Social Connections: Socially Integrated (06/08/2023)   Received from [facility]   Social Network    How would you rate your social network (family, work, friends)?: Good participation with social networks  Intimate Partner Violence: Not At Risk (06/08/2023)   Received from [facility]   HITS    Over the last 12 months how often did your partner physically hurt you?: Never    Over the last 12 months how often did your partner insult you or talk down to you?: Never    Over the last 12 months how often did your partner threaten you with physical harm?: Never    Over the last 12 months how often did your partner scream or curse at you?: Never  Depression (PHQ2-9): Low Risk (02/23/2024)   Depression (PHQ2-9)    PHQ-2 Score: 0  Alcohol Screen: Not on file  Housing: Low Risk (06/08/2023)   Received from [facility]    In the last 12 months, was there a time when you were not able to pay the mortgage or rent on time?: No    In the past 12 months, how many times have you moved where you were living?: 0    At any time in the past 12 months, were you homeless or living in a shelter (including now)?: No  Utilities: Not At Risk (06/08/2023)   Received from [facility] Utilities    Threatened with loss of utilities: No  Health Literacy: Not on file    Review of Systems  All other systems reviewed and are negative.   PHYSICAL EXAMINATION:   BP 116/74 (BP Location: Right Arm, Patient Position: Sitting)   Pulse 81   SpO2 100%     General appearance: alert, cooperative and appears stated age      ASSESSMENT:  Night sweats. Hormonal levels show normal ovarian function.  Expiring Mirena IUD.  ASCUS pap, HR HPV.  Recent bacterial vaginosis.   Hx DVT of  arm.   Status post repair of thoracic outlet.    PLAN:  We discussed and compared her hormonal testing results over time and the changes in the levels explained.  Hormonal blood work can tell current hormone levels but is not able to determine when ovarian function will cease.  We discussed the benefits of having her IUD exchanged to control her periods and potentially help to control hormonal symptoms.  Abnormal pap, colposcopy, and LEEP discussed. Will proceed with colposcopy prior to doing an IUD exchange.  Bacterial vaginosis reviewed.    After her visit, I re-reviewed her hx of her thrombectomy of left arm DVT due to thoracic outlet syndrome.  For her to be able to take HRT, she would need to see a medical specialist such as hematology for approval.    If she is not able to take HRT, we can control menopause symptoms with SSRI/SNRI, Veozah, or Gabapentin.    26 min  total time was spent for this patient encounter, including preparation, face-to-face counseling with the patient, coordination of care, and documentation of the encounter.    "

## 2024-03-22 NOTE — Patient Instructions (Addendum)
 Vaginal Infection (Bacterial Vaginosis): What to Know  Bacterial vaginosis is an infection of the vagina. It happens when the balance of normal germs (bacteria) in the vagina changes. It's common among females ages 88 to 10. If left untreated, it can increase your risk of getting a sexually transmitted infection (STI). If you're pregnant, you need to get treated right away. This infection can cause a baby to be born early or at a low birth weight. What are the causes? This happens when too many harmful germs grow in the vagina. The exact reason why this happens isn't known. You can't get this infection from toilet seats, bedding, swimming pools, or contact with objects around you. What increases the risk? Having new or multiple sexual partners, or unprotected sex. Douching. Using an intrauterine device (IUD). Smoking. Alcohol and drug abuse. Taking certain antibiotics. Being pregnant. You can get a vaginal infection without being sexually active. However, it most often occurs in sexually active females. What are the signs or symptoms? Some females have no symptoms. If you have symptoms, they may include: [place] or white vaginal discharge. It can be watery or foamy. A fish-like smell, especially after sex or during your menstrual period. Itching in and around the vagina. Burning or pain with peeing. How is this diagnosed? This infection is diagnosed based on: Your medical history. A physical exam of the vagina. Checking a sample of vaginal fluid for harmful bacteria or uncommon cells. How is this treated? This condition is treated with antibiotics. These may be given as: A pill. A cream for your vagina. A medicine that you put into your vagina called a suppository. If the infection comes back, you may need more antibiotics. Follow these instructions at home: Medicines Take your medicines only as told. Take or apply your antibiotics as told. Do not stop using them even if you start to  feel better. General instructions If you have a female sexual partner, tell her about the infection. She should see her health care provider. Female partners don't need treatment. Avoid sex until treatment is complete. Drink more fluids as told. Keep the area around your vagina and rectum clean. Wash the area daily with warm water. Wipe yourself from front to back after pooping. If you're breastfeeding, talk to your provider about continuing during treatment. How is this prevented? Self-care Do not douche or use vaginal deodorant sprays. Douching can upset the balance of good and harmful bacteria in the vagina, which can cause an infection to happen again. Wear cotton or cotton-lined underwear. Avoid wearing tight pants or pantyhose, especially in the summer. Safe sex Use condoms correctly and every time you have sex. Use dental dams to protect yourself during oral sex. Limit the number of sexual partners. Get tested for STIs. Your sexual partner should also get tested. Drugs and alcohol Do not smoke, vape, or use nicotine or tobacco. Do not use drugs. Limit the amount of alcohol you drink because it can lead to risky sexual behavior. Where to find more information To learn more: Go to tonerpromos.no. Click Health Topics A-Z. Type bacterial vaginosis in the search box. American Sexual Health Association (ASHA): ashasexualhealth.org U.S. Department of Health and Health And Safety Inspector, Office on Women's Health: travellesson.ca Contact a health care provider if: Your symptoms don't get better, even after treatment. You have more discharge or pain when peeing. You have a fever or chills. You have pain in your belly or pelvis. You have pain during sex. You have vaginal bleeding between menstrual periods.  This information is not intended to replace advice given to you by your health care provider. Make sure you discuss any questions you have with your health care provider. Document Revised:  08/12/2022 Document Reviewed: 08/12/2022 Elsevier Patient Education  2024 Elsevier Inc.  Loop Electrosurgical Excision Procedure Loop electrosurgical excision procedure (LEEP) is the cutting and removal (excision) of tissue from the cervix. The cervix is the bottom part of the uterus that opens into the vagina. The tissue that is removed from the cervix is examined to see if there are cancer cells or cells that might turn into cancer (precancerous cells). LEEP may be done when: You have abnormal bleeding from your cervix. You have an abnormal Pap test result. Your health care provider finds abnormalities on your cervix during an exam. LEEP typically only takes a few minutes and is often done in the health care provider's office. The procedure is safe for women who are trying to get pregnant. The procedure is usually not done during a menstrual period or during pregnancy. Tell a health care provider about: Any allergies you have. All medicines you are taking, including vitamins, herbs, eye drops, creams, and over-the-counter medicines. Any problems you or family members have had with anesthetic medicines. Any bleeding problems you have. Any medical conditions you have or have had. This includes current or past vaginal infections, such as herpes or STIs (sexually transmitted infections). Whether you are pregnant or may be pregnant. If you are having vaginal bleeding on the day of the procedure. What are the risks? Generally, this is a safe procedure. However, problems may occur, including: Infection. Bleeding. Allergic reactions to medicines. Changes or scarring in the cervix. Damage to nearby structures or organs. Increased risk of early (preterm) labor in future pregnancies. What happens before the procedure? Ask your health care provider about: Changing or stopping your regular medicines. This is especially important if you are taking diabetes medicines or blood thinners. Taking  medicines such as aspirin and ibuprofen . These medicines can thin your blood. Do not take these medicines unless your health care provider tells you to take them. Taking over-the-counter medicines, vitamins, herbs, and supplements. Your health care provider may recommend that you take pain medicine before the procedure. Ask your health care provider if you should plan to have a responsible adult take you home after the procedure. What happens during the procedure?  An instrument called a speculum will be placed in your vagina. This will allow your health care provider to see your cervix. You will be given a medicine to numb the area (local anesthetic). The medicine will be injected into your cervix and the surrounding area. A solution will be applied to your cervix. This solution will help the health care provider find the abnormal cells that need to be removed. A thin wire loop will be passed through your vagina to your cervix. The wire will remove layers of abnormal cervical cells. The wire will burn (cauterize) the cervical tissue with an electrical current during cell removal. Open blood vessels will be cauterized to prevent bleeding. You might feel some pressure, aching, and cramping. If you feel like you will faint during the procedure, tell your health care provider right away. A paste may be applied to the cauterized area of your cervix to help control bleeding. The sample of cervical tissue will be sent to a lab and looked at under a microscope. The procedure may vary among health care providers and hospitals. What can I expect after the procedure?  After the procedure, it is common to have: Mild abdominal cramps that may last for up to 1 week. A small amount of pink-tinged or bloody vaginal discharge, including light to moderate bleeding, for 1-2 weeks. A brown- or black-colored discharge coming from your vagina, if a paste was used on the cervix to control bleeding. It is up to you to  get the results of your procedure. Ask your health care provider, or the department that is doing the procedure, when your results will be ready. Follow these instructions at home: Take over-the-counter and prescription medicines only as told by your health care provider. Return to your normal activities as told by your health care provider. Ask your health care provider what activities are safe for you. Do not put anything in your vagina for 2 weeks after the procedure or until your health care provider says that it is okay. This includes tampons, creams, and douches. Do not have sex until your health care provider approves. Keep all follow-up visits. This is important. Contact a health care provider if: You have a fever or chills. You feel very weak. You have blood clots or bleeding that is heavier than a normal menstrual period. Bleeding that soaks a pad in less than 1 hour is considered heavy bleeding. You develop a bad-smelling discharge from your vagina. You have severe abdominal pain or cramping. Summary Loop electrosurgical excision procedure (LEEP) is the removal of tissue from the cervix. The removed tissue will be checked for precancerous cells or cancer cells. LEEP typically only takes a few minutes and is often done in your health care provider's office. Do not put anything in your vagina for 2 weeks after the procedure or until your health care provider says that it is okay. This includes tampons, creams, and douches. Ask your health care provider, or the department that is doing the procedure, when your results will be ready. This information is not intended to replace advice given to you by your health care provider. Make sure you discuss any questions you have with your health care provider. Document Revised: 07/31/2020 Document Reviewed: 07/31/2020 Elsevier Patient Education  2024 Elsevier Inc.  Colposcopy  Colposcopy is a procedure to examine the lowest part of the uterus  (cervix) for abnormalities or signs of disease. This procedure is done using an instrument that makes objects appear larger and provides light (colposcope). During the procedure, your health care provider may remove a tissue sample to look at under a microscope (biopsy). A biopsy may be done if any unusual cells are seen during the colposcopy. You may have a colposcopy if you have: An abnormal Pap smear, also called a Pap test. This screening test is used to check for signs of cancer or infection of the vagina, cervix, and uterus. An HPV (human papillomavirus) test and get a positive result for a type of HPV that puts you at high risk of cancer. Certain conditions or symptoms, such as: A sore, or lesion, on your cervix. Genital warts on your vulva, vagina, or cervix. Pain during sex. Vaginal bleeding, especially after sex. A growth on your cervix (cervical polyp) that needs to be removed. Let your health care provider know about: Any allergies you have, including allergies to medicines, latex, or iodine . All medicines you are taking, including vitamins, herbs, eye drops, creams, and over-the-counter medicines. Any bleeding problems you have. Any surgeries you have had. Any medical conditions you have, such as pelvic inflammatory disease (PID) or an endometrial disorder. The pattern  of your menstrual cycles and the form of birth control (contraception) you use, if any. Your medical history, including any cervical treatments and how well you tolerated the procedure (if you have ever fainted). Whether you are pregnant or may be pregnant. What are the risks? Generally, this is a safe procedure. However, problems may occur, including: Infection. Symptoms of infection may include fever, bad-smelling vaginal discharge, or pelvic pain. Vaginal bleeding. Allergic reactions to medicines. Damage to nearby structures or organs. What happens before the procedure? Medicines Ask your health care provider  about: Changing or stopping your regular medicines. This is especially important if you are taking diabetes medicines or blood thinners. Taking medicines such as aspirin and ibuprofen . These medicines can thin your blood. Do not take these medicines unless your health care provider tells you to take them. Your health care provider will likely tell you to avoid taking aspirin, or medicine that contains aspirin, for 7 days before the procedure. Taking over-the-counter medicines, vitamins, herbs, and supplements. General instructions Tell your health care provider if you have your menstrual period now or will have it at the time of your procedure. A colposcopy is not normally done during your menstrual period. If you use contraception, continue to use it before your procedure. For 24 hours before the procedure: Do not use douche products or tampons. Do not use medicines, creams, or suppositories in the vagina. Do not have sex or insert anything into your vagina. Ask your health care provider what steps will be taken to prevent infection. What happens during the procedure? You will lie down on your back, with your feet in foot rests (stirrups). An instrument called a speculum will be inserted into your vagina. This will be used so your health care provider can see your cervix and the inside of your vagina. A cotton swab will be used to place a small amount of a liquid (solution) on the areas to be examined. This solution makes it easier to see abnormal cells. You may feel a slight burning during this part. The colposcope will be used to scan the cervix with a bright white light. The colposcope will be held near your vulva and will make your vulva, vagina, and cervix look bigger so they can be seen better. If a biopsy is needed: You may be given a medicine to numb the area (local anesthetic). Surgical tools will be used to remove mucus and cells through your vagina. You may feel mild pain while the  tissue sample is removed. Bleeding may occur. A solution may be used to stop the bleeding. The tissue removed will be sent to a lab to be looked at under a microscope. The procedure may vary among health care providers and hospitals. What happens after the procedure? You may have some cramping in your abdomen. This should go away after a few minutes. It is up to you to get the results of your procedure. Ask your health care provider, or the department that is doing the procedure, when your results will be ready. Summary Colposcopy is a procedure to examine the lowest part of the uterus (cervix), for signs of disease. A biopsy may be done as part of the procedure. You may have some cramping in your abdomen. This should go away after a few minutes. It is up to you to get the results of your procedure. Ask your health care provider, or the department that is doing the procedure, when your results will be ready. This information is not  intended to replace advice given to you by your health care provider. Make sure you discuss any questions you have with your health care provider. Document Revised: 07/21/2020 Document Reviewed: 07/21/2020 Elsevier Patient Education  2024 [employer].

## 2024-04-05 ENCOUNTER — Other Ambulatory Visit (HOSPITAL_COMMUNITY)
Admission: RE | Admit: 2024-04-05 | Discharge: 2024-04-05 | Disposition: A | Source: Ambulatory Visit | Attending: Obstetrics and Gynecology | Admitting: Obstetrics and Gynecology

## 2024-04-05 ENCOUNTER — Encounter: Payer: Self-pay | Admitting: Obstetrics and Gynecology

## 2024-04-05 ENCOUNTER — Ambulatory Visit: Admitting: Obstetrics and Gynecology

## 2024-04-05 VITALS — BP 112/76

## 2024-04-05 DIAGNOSIS — R87810 Cervical high risk human papillomavirus (HPV) DNA test positive: Secondary | ICD-10-CM

## 2024-04-05 DIAGNOSIS — Z01812 Encounter for preprocedural laboratory examination: Secondary | ICD-10-CM

## 2024-04-05 DIAGNOSIS — R87610 Atypical squamous cells of undetermined significance on cytologic smear of cervix (ASC-US): Secondary | ICD-10-CM

## 2024-04-05 DIAGNOSIS — Z86718 Personal history of other venous thrombosis and embolism: Secondary | ICD-10-CM

## 2024-04-05 LAB — PREGNANCY, URINE: Preg Test, Ur: NEGATIVE

## 2024-04-05 NOTE — Progress Notes (Signed)
 "  GYNECOLOGY  VISIT   HPI: 49 y.o.   Single  Caucasian female   G2P2002 with No LMP recorded. (Menstrual status: IUD).   here for: Colposcopy   Pap ASCUS, HR HPV positive.   UPT negative.       Patient is interested in potential HRT.  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 She states her cardiothoracic surgeon approved to use of future HRT.  GYNECOLOGIC HISTORY: No LMP recorded. (Menstrual status: IUD). 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/28/2021   Postpartum care following vaginal delivery (9/12) 11/20/2010    Past Medical History:  Diagnosis Date   Abnormal Pap smear    ASCUS, low grade squamous intraepithelial lesion   Abnormal Pap smear of cervix    03-26-22 ascus hpv hr neg   AMA (advanced maternal age) multigravida 35+    Anxiety    Blood transfusion complicating pregnancy 03/09/2005   s/p uterine atony   Low-lying placenta    Postpartum care following vaginal delivery (9/12) 11/20/2010   Seasonal allergies    TOS (thoracic outlet syndrome)    venous, blood clot    Past Surgical History:  Procedure Laterality Date   AV FISTULA PLACEMENT Left 08/28/2021   Procedure: LEFT ARM VENOGRAM INITIATION OF THROMBOLYSIS;  Surgeon: Magda Debby SAILOR, MD;  Location: [facility] OR;  Service: Vascular;  Laterality: Left;   COLPOSCOPY  2017   FIRST RIB REMOVAL  11/2022   INTRAUTERINE DEVICE (IUD) INSERTION     mirena inserted  12/17   KNEE SURGERY  1995   LASIK     MOUTH SURGERY  1994   PERIPHERAL VASCULAR THROMBECTOMY N/A 08/29/2021   Procedure: LYSIS RECHECK;  Surgeon: Magda Debby SAILOR, MD;  Location: [facility] INVASIVE CV LAB;  Service: Cardiovascular;  Laterality: N/A;   WISDOM TOOTH EXTRACTION      Current Outpatient Medications  Medication Sig Dispense Refill   Azelastine HCl (ASTEPRO NA) Place into the nose as needed.     levonorgestrel (MIRENA) 20 MCG/24HR IUD by Intrauterine route.     Multiple Vitamin (MULTIVITAMIN PO) Take 1 tablet by mouth daily.     tretinoin (RETIN-A) 0.05 % cream Apply 1 Application topically at bedtime.     UNABLE TO FIND Med Name: Semaglutide/Pyridoxine 2054mcg-50mg /mL INJ. . Inject 0.25mg  (12.5 units) subcutaneously once weekly for 4 weeks COMPOUNDED BY PHARMACY, ALLOW 2-3 BUSINESS DAYS TO FILL do not use after [BUD] on vial     No current facility-administered medications for this visit.     ALLERGIES: Patient has no known allergies.  Family History  Problem Relation Age of Onset   Hypertension Mother    Mitral valve prolapse Mother    Seizures Mother        in pregnancy   Heart disease Mother    Cancer Mother        breast   Parkinson's disease Mother    Dementia Mother        lewi body   Vision loss Father        glacoma   Mitral valve prolapse Sister    Depression Sister    Migraines Sister    Mental illness Sister    Birth defects Maternal Aunt        Breast   Mitral valve prolapse Maternal Uncle    Heart disease Maternal Uncle    Heart disease Maternal Grandmother    COPD Maternal Grandfather    Vision loss Maternal Grandfather        glacoma   Cancer Paternal Grandfather        prostate ca    Social History   Socioeconomic History   Marital status: Single    Spouse name: Not on file   Number of children: Not on file   Years of education: Not on file   Highest education level: Not on file  Occupational History   Not on file  Tobacco Use    Smoking status: Never   Smokeless tobacco: Never  Vaping Use   Vaping status: Never Used  Substance and Sexual Activity   Alcohol use: Yes    Comment: SOCIAL   Drug use: No   Sexual activity: Yes    Partners: Male    Birth control/protection: I.U.D.    Comment: Mirena IUD inserted 12/17  Other Topics Concern   Not on file  Social History Narrative   Not on file   Social Drivers of Health   Tobacco Use: Low Risk (04/05/2024)   Patient History    Smoking Tobacco Use: Never    Smokeless Tobacco Use: Never    Passive Exposure: Not on file  Financial Resource Strain: Low Risk (06/08/2023)   Received from [facility]   Overall Financial Resource Strain (CARDIA)    Difficulty of Paying Living Expenses: Not hard at all  Food Insecurity: No Food Insecurity (06/08/2023)   Received from [facility]   Epic    Within the past 12 months, you worried that your food would run out before you got the money to buy more.: Never true    Within the past 12 months, the food you bought just didn't last and you didn't have money to get more.: Never true  Transportation Needs: No Transportation Needs (06/08/2023)   Received from [facility] - Transportation    Lack of Transportation (Medical): No    Lack of Transportation (Non-Medical): No  Physical Activity: Sufficiently Active (06/08/2023)   Received from [facility]   Exercise Vital Sign    On average, how many days per week do you engage in moderate to strenuous exercise (like a brisk walk)?: 5 days    On average, how many minutes do you engage in exercise at this level?: 60 min  Stress: No Stress Concern Present (06/08/2023)   Received from [facility] - Occupational Stress Questionnaire    Feeling of Stress : Not at all  Social Connections: Socially Integrated (06/08/2023)   Received from [facility]   Social Network    How would you rate  your social network (family, work, friends)?:  Good participation with social networks  Intimate Partner Violence: Not At Risk (06/08/2023)   Received from [facility]   HITS    Over the last 12 months how often did your partner physically hurt you?: Never    Over the last 12 months how often did your partner insult you or talk down to you?: Never    Over the last 12 months how often did your partner threaten you with physical harm?: Never    Over the last 12 months how often did your partner scream or curse at you?: Never  Depression (PHQ2-9): Low Risk (02/23/2024)   Depression (PHQ2-9)    PHQ-2 Score: 0  Alcohol Screen: Not on file  Housing: Low Risk (06/08/2023)   Received from [facility]    In the last 12 months, was there a time when you were not able to pay the mortgage or rent on time?: No    In the past 12 months, how many times have you moved where you were living?: 0    At any time in the past 12 months, were you homeless or living in a shelter (including now)?: No  Utilities: Not At Risk (06/08/2023)   Received from [facility] Utilities    Threatened with loss of utilities: No  Health Literacy: Not on file    Review of Systems  See HPI.    PHYSICAL EXAMINATION:   BP 112/76 (BP Location: Left Arm, Patient Position: Sitting)     General appearance: alert, cooperative and appears stated age   Colposcopy - cervix, vagina. Consent for procedure.  Time out done.  3% acetic acid used in vagina and on cervix. White light and green light filter used.  Colposcopy satisfactory:  Yes   ___x__          No    _____ Findings:    Cervix:  IUD strings not seen.  Small concentrated area of AW thickening at 6:00. Vagina:  no lesions.  Biopsies:   ECC, biopsy at 6:00.  Monsel's placed.  Minimal EBL. No complications.    Chaperone was present for exam:  Kari HERO, CMA  ASSESSMENT:  ASCUS pap, neg HR HPV.  Hx CIN I. Mirena IUD, just expired in December, 2025.  Strings not seen today.  Menopausal symptoms.  Hx  thoracic outlet repair.  Hx upper extremity DVT.   PLAN:  Paps, HPV, colposcopy, and LEEP discussed.  Follow up biopsy results.  If no treatment needed, will plan for IUD exchange.  Will need approval from vascular surgeon or oncology about use of HRT.   Addendum:  chart review done following the colposcopy visit.  See Epic note from 07/16/23, Dr. Liza, cardiovascular surgeon.  Patient ok for use of estrogen as the cause of the upper extremity DVT was mechanical and has been repaired.     "

## 2024-04-05 NOTE — Patient Instructions (Signed)
 Colposcopy, Care After  The following information offers guidance on how to care for yourself after your procedure. Your health care provider may also give you more specific instructions. If you have problems or questions, contact your health care provider. What can I expect after the procedure? If you had a colposcopy without a biopsy, you can expect to feel fine right away after your procedure. However, you may have some spotting of blood for a few days. You can return to your normal activities. If you had a colposcopy with a biopsy, it is common after the procedure to have: Soreness and mild pain. These may last for a few days. Mild vaginal bleeding or discharge that is dark-colored and grainy. This may last for a few days. The discharge may be caused by a liquid (solution) that was used during the procedure. You may need to wear a sanitary pad during this time. Spotting of blood for at least 48 hours after the procedure. Follow these instructions at home: Medicines Take over-the-counter and prescription medicines only as told by your health care provider. Talk with your health care provider about what type of over-the-counter pain medicines and prescription medicines you can start to take again. It is especially important to talk with your health care provider if you take blood thinners. Activity Avoid using douche products, using tampons, and having sex for at least 3 days after the procedure or for as long as told by your health care provider. Return to your normal activities as told by your health care provider. Ask your health care provider what activities are safe for you. General instructions Ask your health care provider if you may take baths, swim, or use a hot tub. You may take showers. If you use birth control (contraception), continue to use it. Keep all follow-up visits. This is important. Contact a health care provider if: You have a fever or chills. You faint or feel  light-headed. Get help right away if: You have heavy bleeding from your vagina or pass blood clots. Heavy bleeding is bleeding that soaks through a sanitary pad in less than 1 hour. You have vaginal discharge that is abnormal, is yellow in color, or smells bad. This could be a sign of infection. You have severe pain or cramps in your lower abdomen that do not go away with medicine. Summary If you had a colposcopy without a biopsy, you can expect to feel fine right away, but you may have some spotting of blood for a few days. You can return to your normal activities. If you had a colposcopy with a biopsy, it is common to have mild pain for a few days and spotting for 48 hours after the procedure. Avoid using douche products, using tampons, and having sex for at least 3 days after the procedure or for as long as told by your health care provider. Get help right away if you have heavy bleeding, severe pain, or signs of infection. This information is not intended to replace advice given to you by your health care provider. Make sure you discuss any questions you have with your health care provider. Document Revised: 07/21/2020 Document Reviewed: 07/21/2020 [employer] Patient Education  2024 [employer].

## 2024-04-11 LAB — SURGICAL PATHOLOGY

## 2024-04-12 ENCOUNTER — Ambulatory Visit: Payer: Self-pay | Admitting: Obstetrics and Gynecology

## 2024-04-12 DIAGNOSIS — Z30433 Encounter for removal and reinsertion of intrauterine contraceptive device: Secondary | ICD-10-CM

## 2024-04-12 NOTE — Telephone Encounter (Signed)
1yr recall placed. 

## 2024-05-15 ENCOUNTER — Ambulatory Visit: Admitting: Obstetrics and Gynecology
